# Patient Record
Sex: Male | Born: 1970
Health system: Southern US, Community
[De-identification: ages and names within clinical notes are randomized; demographics above are authoritative.]

## PROBLEM LIST (undated history)

## (undated) DIAGNOSIS — I10 Essential (primary) hypertension: Secondary | ICD-10-CM

## (undated) HISTORY — DX: Essential (primary) hypertension: I10

## (undated) HISTORY — PX: NO PAST SURGERIES: SHX2092

## (undated) HISTORY — DX: Morbid (severe) obesity due to excess calories: E66.01

---

## 1998-08-26 ENCOUNTER — Emergency Department (HOSPITAL_COMMUNITY): Admission: EM | Admit: 1998-08-26 | Discharge: 1998-08-26 | Payer: Self-pay | Admitting: Emergency Medicine

## 1998-11-01 ENCOUNTER — Encounter: Admission: RE | Admit: 1998-11-01 | Discharge: 1998-11-01 | Payer: Self-pay | Admitting: Hematology and Oncology

## 1999-12-13 ENCOUNTER — Emergency Department (HOSPITAL_COMMUNITY): Admission: EM | Admit: 1999-12-13 | Discharge: 1999-12-14 | Payer: Self-pay | Admitting: Emergency Medicine

## 2004-06-01 ENCOUNTER — Emergency Department (HOSPITAL_COMMUNITY): Admission: EM | Admit: 2004-06-01 | Discharge: 2004-06-01 | Payer: Self-pay | Admitting: Emergency Medicine

## 2004-06-24 ENCOUNTER — Ambulatory Visit: Payer: Self-pay | Admitting: Internal Medicine

## 2004-07-16 ENCOUNTER — Encounter: Admission: RE | Admit: 2004-07-16 | Discharge: 2004-07-16 | Payer: Self-pay | Admitting: Internal Medicine

## 2004-07-16 ENCOUNTER — Ambulatory Visit: Payer: Self-pay | Admitting: Internal Medicine

## 2004-07-30 ENCOUNTER — Emergency Department (HOSPITAL_COMMUNITY): Admission: EM | Admit: 2004-07-30 | Discharge: 2004-07-30 | Payer: Self-pay | Admitting: Emergency Medicine

## 2004-09-30 ENCOUNTER — Ambulatory Visit: Payer: Self-pay | Admitting: Internal Medicine

## 2004-11-06 ENCOUNTER — Ambulatory Visit: Payer: Self-pay | Admitting: Internal Medicine

## 2004-11-22 ENCOUNTER — Ambulatory Visit: Payer: Self-pay | Admitting: Internal Medicine

## 2005-04-07 ENCOUNTER — Ambulatory Visit: Payer: Self-pay | Admitting: Internal Medicine

## 2005-09-22 ENCOUNTER — Ambulatory Visit: Payer: Self-pay | Admitting: Internal Medicine

## 2005-10-20 ENCOUNTER — Encounter: Admission: RE | Admit: 2005-10-20 | Discharge: 2006-01-18 | Payer: Self-pay | Admitting: Internal Medicine

## 2006-01-19 ENCOUNTER — Ambulatory Visit: Payer: Self-pay | Admitting: Internal Medicine

## 2006-01-20 ENCOUNTER — Ambulatory Visit: Payer: Self-pay | Admitting: Internal Medicine

## 2006-02-09 ENCOUNTER — Ambulatory Visit: Payer: Self-pay | Admitting: Internal Medicine

## 2006-05-12 ENCOUNTER — Ambulatory Visit: Payer: Self-pay | Admitting: Internal Medicine

## 2006-06-02 ENCOUNTER — Ambulatory Visit: Payer: Self-pay | Admitting: Internal Medicine

## 2006-06-30 ENCOUNTER — Ambulatory Visit: Payer: Self-pay | Admitting: Internal Medicine

## 2006-07-01 ENCOUNTER — Ambulatory Visit: Payer: Self-pay | Admitting: Internal Medicine

## 2006-07-06 ENCOUNTER — Ambulatory Visit: Payer: Self-pay | Admitting: Gastroenterology

## 2006-08-12 ENCOUNTER — Ambulatory Visit: Payer: Self-pay | Admitting: Internal Medicine

## 2006-09-28 ENCOUNTER — Ambulatory Visit: Payer: Self-pay | Admitting: Internal Medicine

## 2006-09-28 LAB — CONVERTED CEMR LAB
BUN: 7 mg/dL (ref 6–23)
CO2: 28 meq/L (ref 19–32)
Calcium: 8.9 mg/dL (ref 8.4–10.5)
Chloride: 98 meq/L (ref 96–112)
Creatinine, Ser: 1 mg/dL (ref 0.4–1.5)
GFR calc Af Amer: 111 mL/min
GFR calc non Af Amer: 91 mL/min
Glucose, Bld: 82 mg/dL (ref 70–99)
Potassium: 3.5 meq/L (ref 3.5–5.1)
Sodium: 134 meq/L — ABNORMAL LOW (ref 135–145)

## 2007-01-27 IMAGING — US US ABDOMEN COMPLETE
1 series · 13 of 25 positions shown · non-contrast
Comparison: none

[Series 1: us abdomen complete · 0.16mm/px · 13 of 55 slices shown]
[im 1/55]
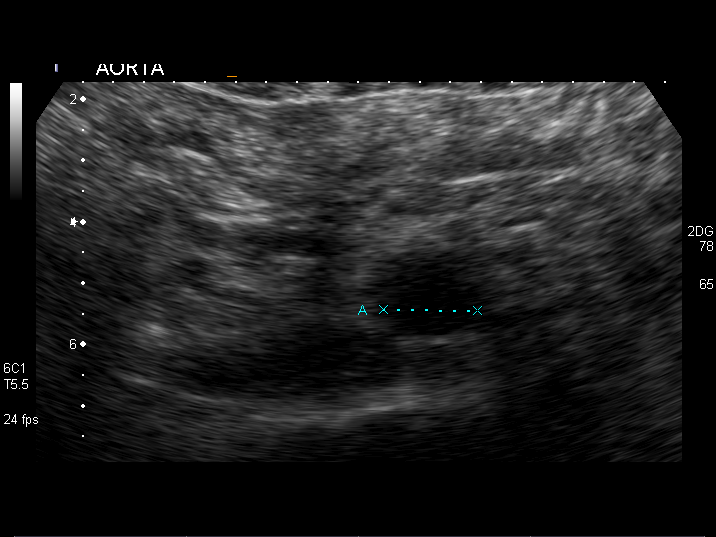
[im 5/55]
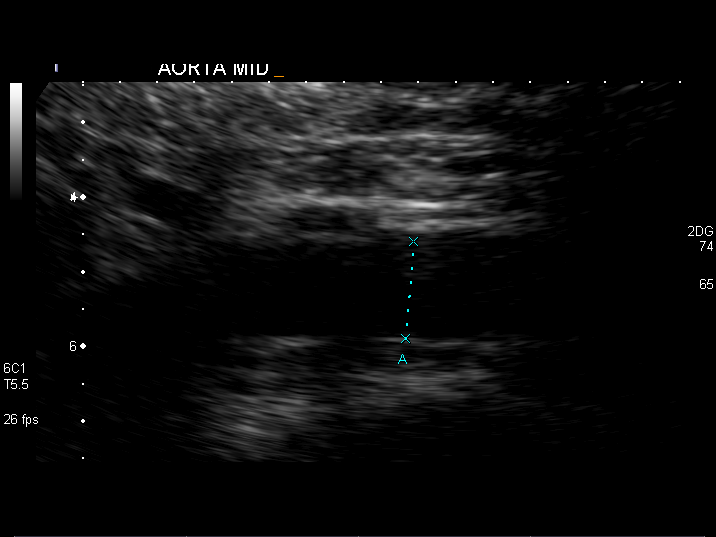
[im 10/55]
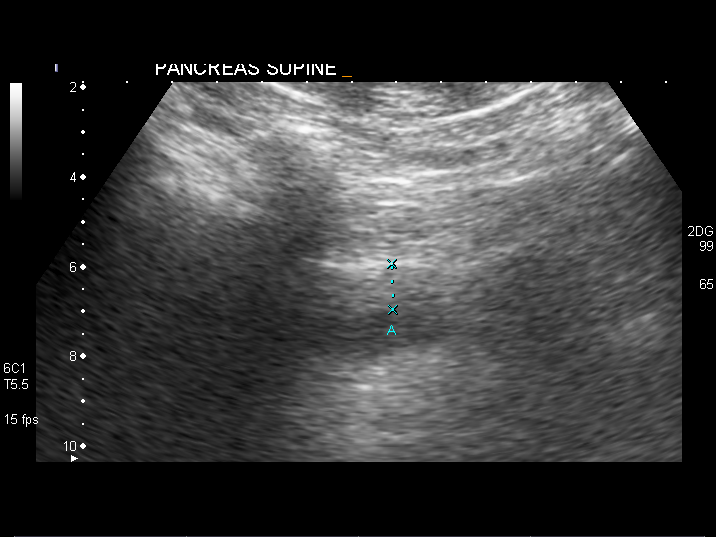
[im 14/55]
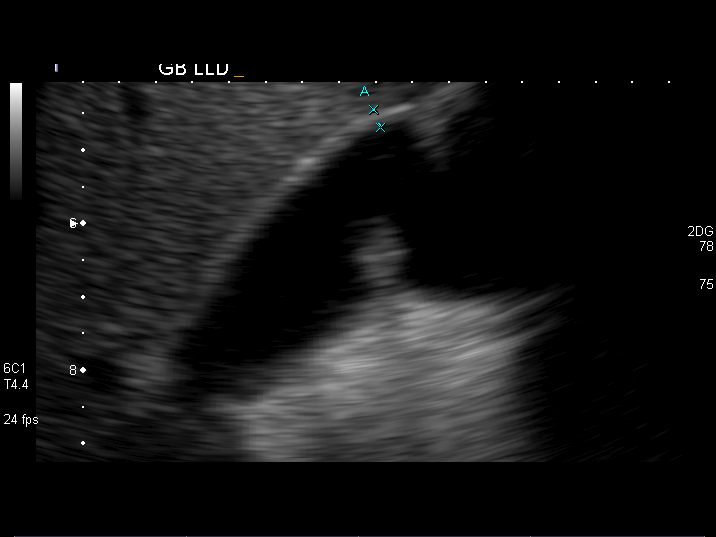
[im 19/55]
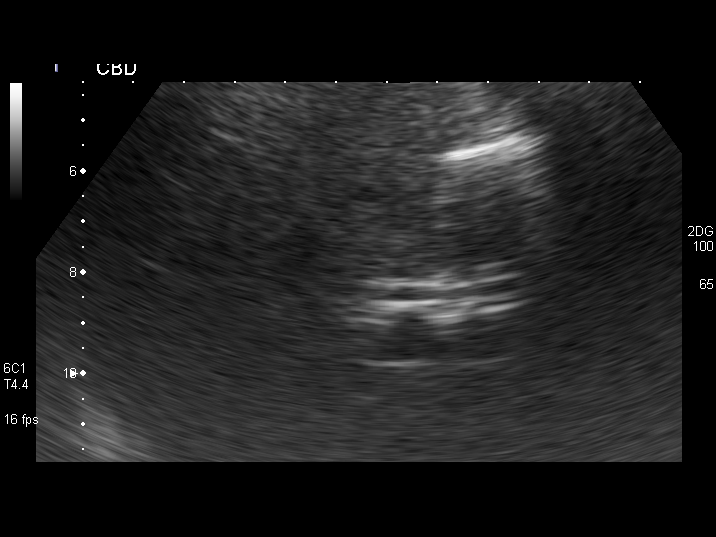
[im 23/55]
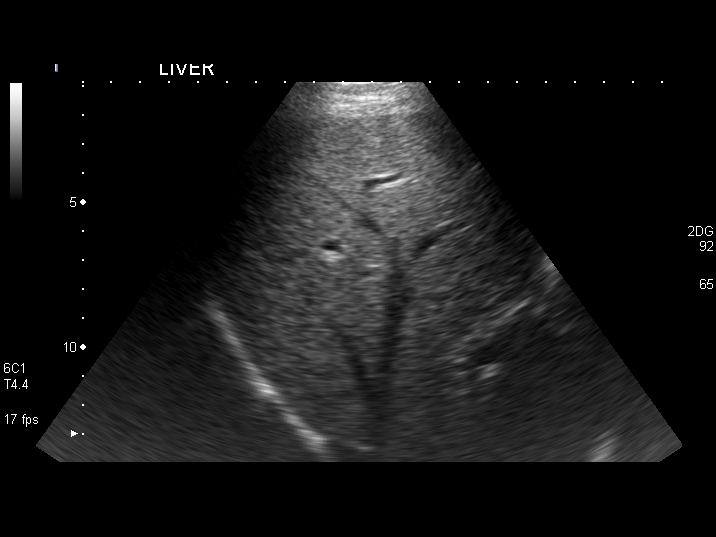
[im 28/55]
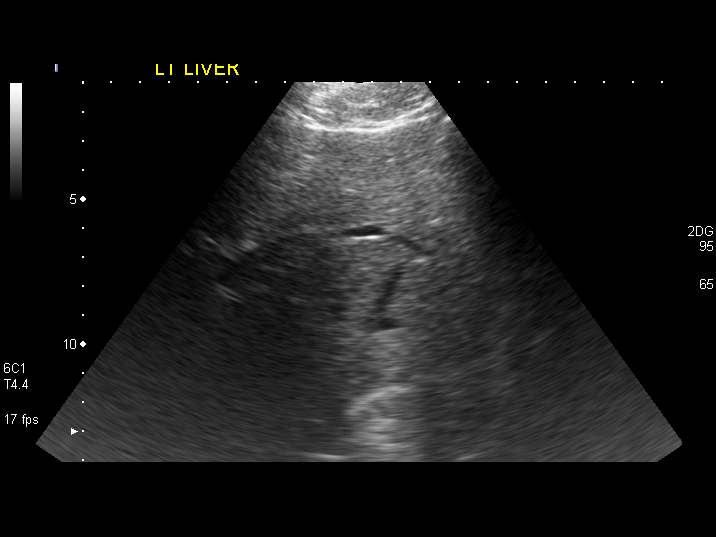
[im 32/55]
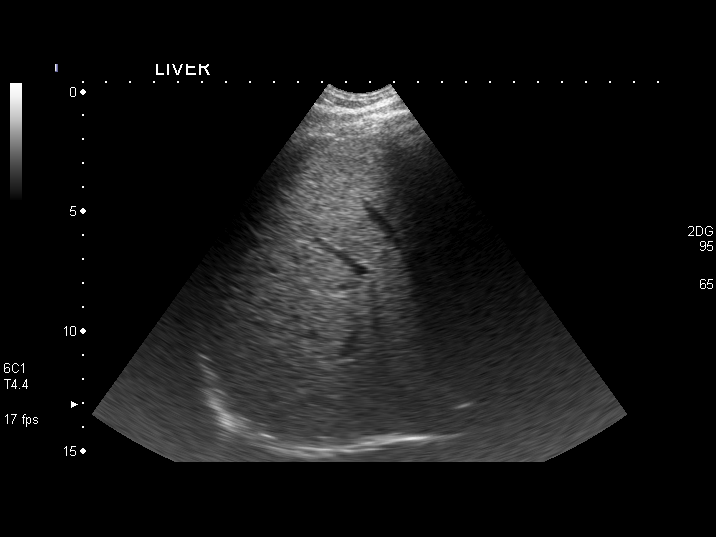
[im 37/55]
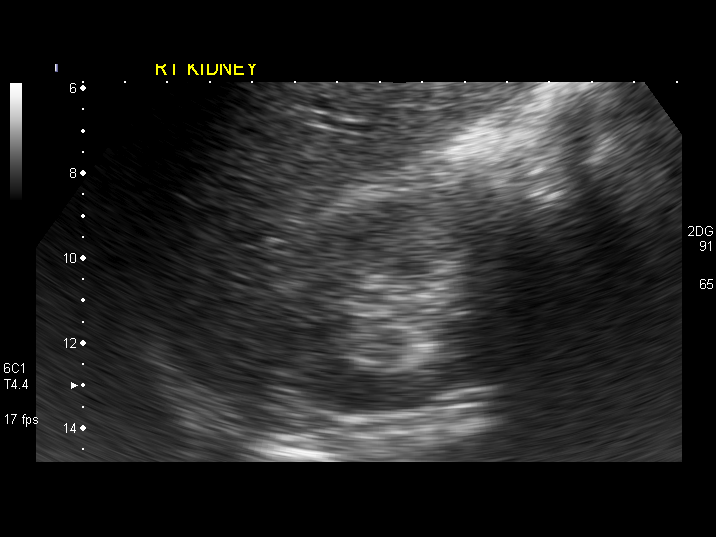
[im 41/55]
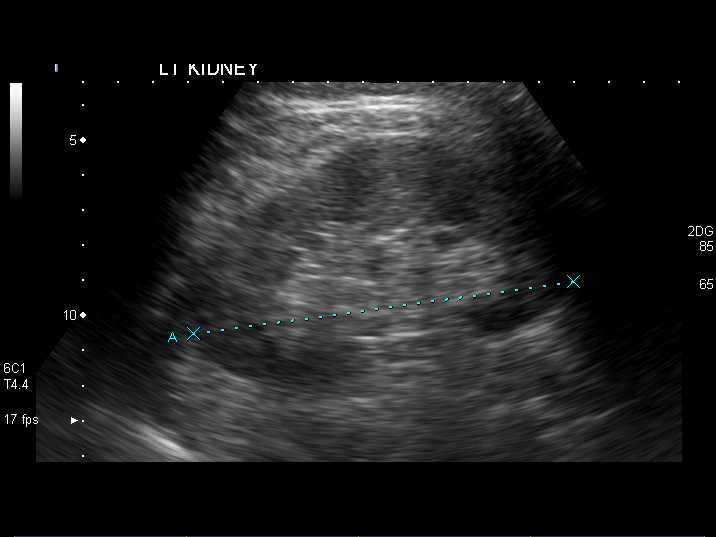
[im 46/55]
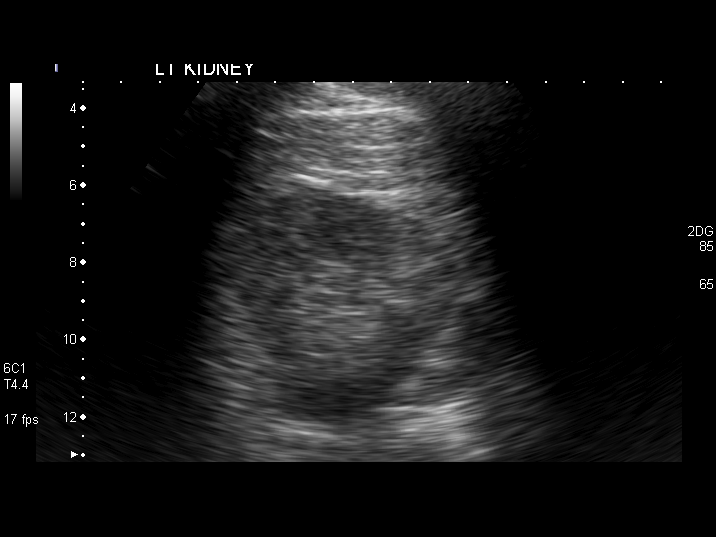
[im 50/55]
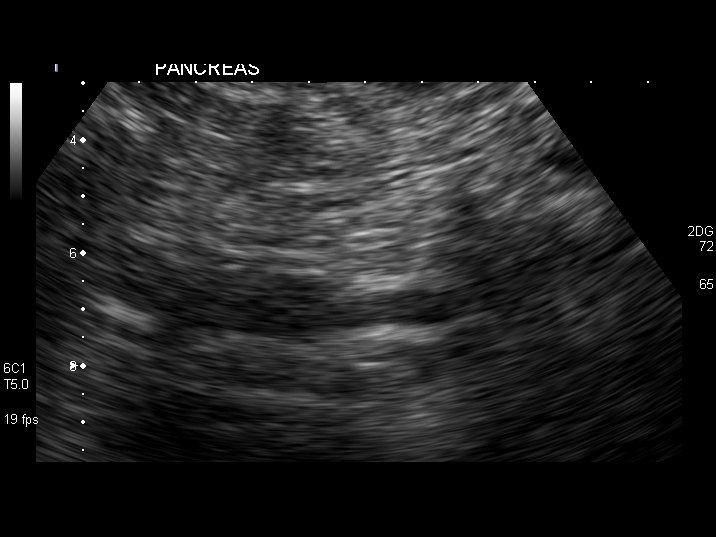
[im 55/55]
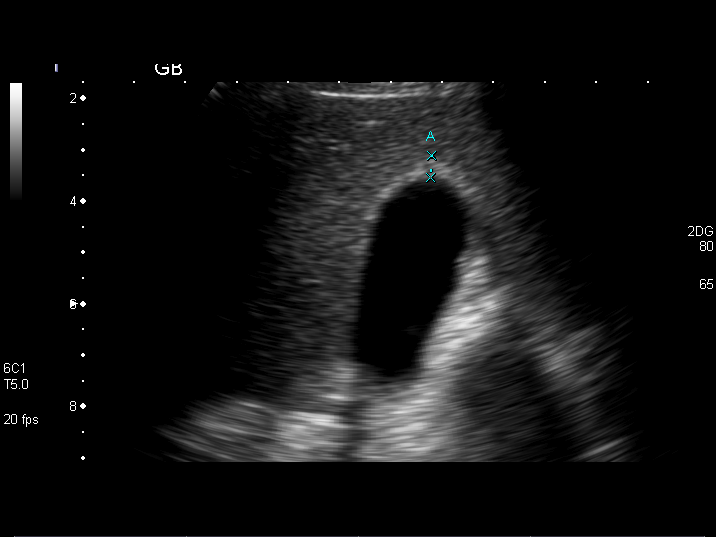

[13 of 25 positions shown; findings below may reference images not displayed]

ACCESSION NUMBER:  [id number].

ORDERING PHYSICIAN:  Dr. Ryuto Sawafuji. 

READING PHYSICIAN:  Askor, Arsu

PROCEDURE:  Multiplanar abdominal ultrasound imaging was performed in the upright, supine, right and left lateral decubitus positions.

RESULTS
Abdominal aorta measures 1.5 cm in maximal diameter.  The IVC is patent. 

The body of the  pancreas appears normal.  The head and tail of the pancreas are not imaged.  

In the gallbladder there is slight wall thickening with one area measuring 4.3 mm and another area 3.7 mm.  Other areas of the gallbladder wall appear normal The remainder of the gallbladder is exam is normal with no pericholecystic fluid or intraluminal echogenic foci to suggest gallstone disease.  

The common bile duct measures 4 mm in maximal diameter without evidence of intraluminal foci.

The liver appears normal without evidence of parenchymal lesion, ductal dilatation or vascular abnormality.

The right kidney measures 9.3 cm and left kidney 10.9 cm.  Both kidneys are normal in appearance.

Spleen is normal in size, measuring 9.3 cm without parenchymal lesion.

IMPRESSION 
1.  Mildly thickened gallbladder walls.
2.  Limited pancreatic imaging.

## 2007-04-20 ENCOUNTER — Encounter (INDEPENDENT_AMBULATORY_CARE_PROVIDER_SITE_OTHER): Payer: Self-pay | Admitting: *Deleted

## 2007-04-21 ENCOUNTER — Telehealth (INDEPENDENT_AMBULATORY_CARE_PROVIDER_SITE_OTHER): Payer: Self-pay | Admitting: *Deleted

## 2007-05-12 ENCOUNTER — Ambulatory Visit: Payer: Self-pay | Admitting: Internal Medicine

## 2007-05-12 ENCOUNTER — Encounter (INDEPENDENT_AMBULATORY_CARE_PROVIDER_SITE_OTHER): Payer: Self-pay | Admitting: Internal Medicine

## 2007-05-12 DIAGNOSIS — K589 Irritable bowel syndrome without diarrhea: Secondary | ICD-10-CM

## 2007-05-12 DIAGNOSIS — I10 Essential (primary) hypertension: Secondary | ICD-10-CM | POA: Insufficient documentation

## 2007-05-14 LAB — CONVERTED CEMR LAB
ALT: 26 units/L (ref 0–53)
AST: 28 units/L (ref 0–37)
BUN: 10 mg/dL (ref 6–23)
Basophils Absolute: 0 10*3/uL (ref 0.0–0.1)
Basophils Relative: 0.2 % (ref 0.0–1.0)
CO2: 31 meq/L (ref 19–32)
Calcium: 9.5 mg/dL (ref 8.4–10.5)
Chloride: 105 meq/L (ref 96–112)
Cholesterol: 185 mg/dL (ref 0–200)
Creatinine, Ser: 1.1 mg/dL (ref 0.4–1.5)
Eosinophils Absolute: 0 10*3/uL (ref 0.0–0.6)
Eosinophils Relative: 0.8 % (ref 0.0–5.0)
GFR calc Af Amer: 99 mL/min
GFR calc non Af Amer: 81 mL/min
Glucose, Bld: 92 mg/dL (ref 70–99)
HCT: 43.7 % (ref 39.0–52.0)
HDL: 44.8 mg/dL (ref >39.0)
Hemoglobin: 14.8 g/dL (ref 13.0–17.0)
LDL Cholesterol: 129 mg/dL — ABNORMAL HIGH (ref 0–99)
Lymphocytes Relative: 34.5 % (ref 12.0–46.0)
MCHC: 33.9 g/dL (ref 30.0–36.0)
MCV: 87.8 fL (ref 78.0–100.0)
Monocytes Absolute: 0.4 10*3/uL (ref 0.2–0.7)
Monocytes Relative: 8.1 % (ref 3.0–11.0)
Neutro Abs: 2.7 10*3/uL (ref 1.4–7.7)
Neutrophils Relative %: 56.4 % (ref 43.0–77.0)
Platelets: 337 10*3/uL (ref 150–400)
Potassium: 4.7 meq/L (ref 3.5–5.1)
RBC: 4.98 M/uL (ref 4.22–5.81)
RDW: 12.8 % (ref 11.5–14.6)
Sodium: 141 meq/L (ref 135–145)
TSH: 0.47 microintl units/mL (ref 0.35–5.50)
Total CHOL/HDL Ratio: 4.1
Triglycerides: 56 mg/dL (ref 0–149)
VLDL: 11 mg/dL (ref 0–40)
WBC: 4.7 10*3/uL (ref 4.5–10.5)

## 2007-05-19 ENCOUNTER — Telehealth (INDEPENDENT_AMBULATORY_CARE_PROVIDER_SITE_OTHER): Payer: Self-pay | Admitting: *Deleted

## 2007-05-24 ENCOUNTER — Encounter: Payer: Self-pay | Admitting: Internal Medicine

## 2007-09-30 ENCOUNTER — Telehealth (INDEPENDENT_AMBULATORY_CARE_PROVIDER_SITE_OTHER): Payer: Self-pay | Admitting: *Deleted

## 2008-08-14 ENCOUNTER — Ambulatory Visit: Payer: Self-pay | Admitting: Internal Medicine

## 2008-08-21 LAB — CONVERTED CEMR LAB
ALT: 21 units/L (ref 0–53)
AST: 23 units/L (ref 0–37)
BUN: 11 mg/dL (ref 6–23)
Basophils Absolute: 0 10*3/uL (ref 0.0–0.1)
Basophils Relative: 0.3 % (ref 0.0–3.0)
CO2: 31 meq/L (ref 19–32)
Calcium: 9.5 mg/dL (ref 8.4–10.5)
Chloride: 105 meq/L (ref 96–112)
Cholesterol: 205 mg/dL (ref 0–200)
Creatinine, Ser: 0.8 mg/dL (ref 0.4–1.5)
Direct LDL: 125.6 mg/dL
Eosinophils Absolute: 0 10*3/uL (ref 0.0–0.7)
Eosinophils Relative: 0.6 % (ref 0.0–5.0)
GFR calc Af Amer: 141 mL/min
GFR calc non Af Amer: 117 mL/min
Glucose, Bld: 87 mg/dL (ref 70–99)
HCT: 44.8 % (ref 39.0–52.0)
HDL: 67.1 mg/dL (ref >39.0)
Hemoglobin: 15.4 g/dL (ref 13.0–17.0)
Lymphocytes Relative: 44.9 % (ref 12.0–46.0)
MCHC: 34.4 g/dL (ref 30.0–36.0)
MCV: 88.8 fL (ref 78.0–100.0)
Monocytes Absolute: 0.3 10*3/uL (ref 0.1–1.0)
Monocytes Relative: 7.6 % (ref 3.0–12.0)
Neutro Abs: 1.8 10*3/uL (ref 1.4–7.7)
Neutrophils Relative %: 46.6 % (ref 43.0–77.0)
Platelets: 279 10*3/uL (ref 150–400)
Potassium: 4.1 meq/L (ref 3.5–5.1)
RBC: 5.04 M/uL (ref 4.22–5.81)
RDW: 12.6 % (ref 11.5–14.6)
Sodium: 142 meq/L (ref 135–145)
TSH: 0.82 microintl units/mL (ref 0.35–5.50)
Total CHOL/HDL Ratio: 3.1
Triglycerides: 41 mg/dL (ref 0–149)
VLDL: 8 mg/dL (ref 0–40)
WBC: 3.9 10*3/uL — ABNORMAL LOW (ref 4.5–10.5)

## 2009-10-17 ENCOUNTER — Telehealth (INDEPENDENT_AMBULATORY_CARE_PROVIDER_SITE_OTHER): Payer: Self-pay | Admitting: *Deleted

## 2009-10-17 ENCOUNTER — Encounter (INDEPENDENT_AMBULATORY_CARE_PROVIDER_SITE_OTHER): Payer: Self-pay | Admitting: *Deleted

## 2009-11-21 ENCOUNTER — Ambulatory Visit: Payer: Self-pay | Admitting: Internal Medicine

## 2009-11-23 LAB — CONVERTED CEMR LAB
ALT: 17 units/L (ref 0–53)
AST: 21 units/L (ref 0–37)
BUN: 15 mg/dL (ref 6–23)
Basophils Absolute: 0 10*3/uL (ref 0.0–0.1)
Basophils Relative: 0.6 % (ref 0.0–3.0)
CO2: 32 meq/L (ref 19–32)
Calcium: 9.3 mg/dL (ref 8.4–10.5)
Chloride: 104 meq/L (ref 96–112)
Cholesterol: 185 mg/dL (ref 0–200)
Creatinine, Ser: 1.1 mg/dL (ref 0.4–1.5)
Eosinophils Absolute: 0 10*3/uL (ref 0.0–0.7)
Eosinophils Relative: 0.2 % (ref 0.0–5.0)
GFR calc non Af Amer: 97.01 mL/min (ref >60)
Glucose, Bld: 82 mg/dL (ref 70–99)
HCT: 45.1 % (ref 39.0–52.0)
HDL: 65.3 mg/dL (ref >39.00)
Hemoglobin: 15.4 g/dL (ref 13.0–17.0)
LDL Cholesterol: 109 mg/dL — ABNORMAL HIGH (ref 0–99)
Lymphocytes Relative: 41.8 % (ref 12.0–46.0)
Lymphs Abs: 1.3 10*3/uL (ref 0.7–4.0)
MCHC: 34.1 g/dL (ref 30.0–36.0)
MCV: 89.2 fL (ref 78.0–100.0)
Monocytes Absolute: 0.4 10*3/uL (ref 0.1–1.0)
Monocytes Relative: 10.8 % (ref 3.0–12.0)
Neutro Abs: 1.5 10*3/uL (ref 1.4–7.7)
Neutrophils Relative %: 46.6 % (ref 43.0–77.0)
Platelets: 311 10*3/uL (ref 150.0–400.0)
Potassium: 4.5 meq/L (ref 3.5–5.1)
RBC: 5.05 M/uL (ref 4.22–5.81)
RDW: 13.8 % (ref 11.5–14.6)
Sodium: 142 meq/L (ref 135–145)
TSH: 0.66 microintl units/mL (ref 0.35–5.50)
Total CHOL/HDL Ratio: 3
Triglycerides: 54 mg/dL (ref 0.0–149.0)
VLDL: 10.8 mg/dL (ref 0.0–40.0)
WBC: 3.2 10*3/uL — ABNORMAL LOW (ref 4.5–10.5)

## 2010-07-22 ENCOUNTER — Telehealth (INDEPENDENT_AMBULATORY_CARE_PROVIDER_SITE_OTHER): Payer: Self-pay | Admitting: *Deleted

## 2010-08-26 ENCOUNTER — Telehealth (INDEPENDENT_AMBULATORY_CARE_PROVIDER_SITE_OTHER): Payer: Self-pay | Admitting: *Deleted

## 2010-09-17 NOTE — Letter (Signed)
Summary: Primary Care Appointment Letter  [facility]  [address] [place], [place] [address]   Phone: [phone]  Fax: [phone]    10/17/2009 MRN: [id number]  [name] [name] [address] [place], [place]  [address]  Dear [name],   Your Primary Care Physician Nolon Rod. Paz MD has indicated that:    ___x____it is time to schedule an appointment. Schedule a physical.    _______you missed your appointment on______ and need to call and          reschedule.    _______you need to have lab work done.    _______you need to schedule an appointment discuss lab or test results.    _______you need to call to reschedule your appointment that is                       scheduled on _________.     Please call our office as soon as possible. Our phone number is [phone]_______. Please press option 1. Our office is open 8a-12noon and 1p-5p, Monday through Friday.     Thank you,    [facility]

## 2010-09-17 NOTE — Assessment & Plan Note (Signed)
Summary: cpx//pt will be fasting//lch   Vital Signs:  Patient profile:   40 year old male Height:      65.75 inches Weight:      176 pounds BMI:     28.73 Pulse rate:   72 / minute BP sitting:   120 / 80  Vitals Entered By: Shary Decamp (November 21, 2009 9:38 AM) CC: cpx - fasting Is Patient Diabetic? No   History of Present Illness: CPX feels very well   Preventive Screening-Counseling & Management  Caffeine-Diet-Exercise     Does Patient Exercise: yes     Times/week: 6  Current Medications (verified): 1)  Lotrel 10-20 Mg  Caps (Amlodipine Besy-Benazepril Hcl) .Marland Kitchen.. 1 By Mouth Once Daily  Brand Name Medicaly Necessary -  Allergies (verified): No Known Drug Allergies  Past History:  Past Medical History: Reviewed history from 05/12/2007 and no changes required. Hypertension  Past Surgical History: Reviewed history from 08/14/2008 and no changes required. no  Family History: Reviewed history from 08/14/2008 and no changes required. 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 CV:  Denies chest pain or discomfort and palpitations. Resp:  Denies cough and shortness of breath. GI:  Denies bloody stools, diarrhea, and nausea. GU:  Denies dysuria and hematuria.  Physical Exam  General:  alert, well-developed, and well-nourished.   Neck:  no thyromegaly.   Lungs:  normal respiratory effort, no intercostal retractions, no accessory muscle use, and normal breath sounds.   Heart:  normal rate, regular rhythm, and no murmur.   Abdomen:  soft, non-tender, no distention, no masses, and no guarding.   Extremities:  no pretibial edema bilaterally  Psych:  Cognition and judgment appear intact. Alert and cooperative with normal attention span and  concentration.     Impression & Recommendations:  Problem # 1:  HEALTH SCREENING (ICD-V70.0) Td 2008 great life style! (see above) labs   Orders: Venipuncture ([address]) TLB-BMP (Basic Metabolic Panel-BMET) (80048-METABOL) TLB-CBC Platelet - w/Differential (85025-CBCD) TLB-Lipid Panel (80061-LIPID) TLB-ALT (SGPT) (84460-ALT) TLB-AST (SGOT) (84450-SGOT) TLB-TSH (Thyroid Stimulating Hormone) (84443-TSH)  Problem # 2:  HYPERTENSION (ICD-401.9) well controlled , to call if BP gets < 110 His updated medication list for this problem includes:    Lotrel 10-20 Mg Caps (Amlodipine besy-benazepril hcl) .Marland Kitchen... 1 by mouth once daily  brand name medicaly necessary -  BP today: 120/80 Prior BP: 122/80 (08/14/2008)  Labs Reviewed: K+: 4.1 (08/14/2008) Creat: : 0.8 (08/14/2008)   Chol: 205 (08/14/2008)   HDL: 67.1 (08/14/2008)   LDL: DEL (08/14/2008)   TG: 41 (08/14/2008)  Complete Medication List: 1)  Lotrel 10-20 Mg Caps (Amlodipine besy-benazepril hcl) .Marland Kitchen.. 1 by mouth once daily  brand name medicaly necessary -  Patient Instructions: 1)  Please schedule a follow-up appointment in 1 year.  Prescriptions: LOTREL 10-20 MG  CAPS (AMLODIPINE BESY-BENAZEPRIL HCL) 1 by mouth once daily  brand name medicaly necessary -  #90 x 4   Entered and Authorized by:   Nolon Rod. Wren Gallaga MD   Signed by:   Nolon Rod. Orianna Biskup MD on 11/21/2009   Method used:   [facility]        [facility] [address] [phone]       [address]  [place], [place]  [address]       Ph: [phone] or [phone]       Fax: [phone]   RxID:   [id number]    Risk Factors:  Exercise:  yes    Times per week:  6    Preventive Care Screening  Prior Values:    Last Tetanus Booster:  Tdap (05/12/2007)

## 2010-09-17 NOTE — Progress Notes (Signed)
Summary: due cpx  Phone Note Outgoing Call Call back at [facility] Phone [phone] Call back at Work Phone [phone]   Summary of Call: I refilled his BP med x 1 month only.  Last ov here was 12/09.  No additional refills without ov.  Please schedule him for a cpx Shary Decamp  October 17, 2009 1:28 PM     Additional Follow-up for Phone Call Additional follow up Details #2::    mailed a letter.Marland KitchenMarland KitchenBarb Merino  October 17, 2009 2:38 PM  Follow-up by: Barb Merino,  October 17, 2009 2:38 PM

## 2010-09-17 NOTE — Progress Notes (Signed)
Summary: REFILL  Phone Note Refill Request Message from:  Fax from Pharmacy on July 22, 2010 4:51 PM  Refills Requested: Medication #1:  LOTREL 10-20 MG  CAPS 1 by mouth once daily  brand name medicaly necessary - [BMN]. [facility] - Valinda Hoar [phone]  Initial call taken by: Okey Regal [place],  July 22, 2010 4:51 PM    Prescriptions: LOTREL 10-20 MG  CAPS (AMLODIPINE BESY-BENAZEPRIL HCL) 1 by mouth once daily  brand name medicaly necessary - Brand medically necessary #90 x 1   Entered by:   Army Fossa CMA   Authorized by:   Nolon Rod. Paz MD   Signed by:   Army Fossa CMA on 07/22/2010   Method used:   Faxed to ...       [facility] (mail-order)             , [place]         Ph: [phone]       Fax: [phone]   RxID:   [id number]

## 2010-09-19 NOTE — Progress Notes (Signed)
Summary: Refill Request  Phone Note Refill Request Call back at [phone] Message from:  Pharmacy on August 26, 2010 8:43 AM  Refills Requested: Medication #1:  LOTREL 10-20 MG  CAPS 1 by mouth once daily  brand name medicaly necessary - [BMN].   Dosage confirmed as above?Dosage Confirmed   Brand Name Necessary? No   Supply Requested: 1 month   Last Refilled: 07/22/2010 [facility]  Next Appointment Scheduled: none Initial call taken by: Harold Barban,  August 26, 2010 8:44 AM    Prescriptions: LOTREL 10-20 MG  CAPS (AMLODIPINE BESY-BENAZEPRIL HCL) 1 by mouth once daily  brand name medicaly necessary - Brand medically necessary #90 x 0   Entered by:   Army Fossa CMA   Authorized by:   Nolon Rod. Paz MD   Signed by:   Army Fossa CMA on 08/26/2010   Method used:   Faxed to ...       [facility] (mail-order)             , [place]         Ph: [phone]       Fax: [phone]   RxID:   [id number]

## 2010-09-23 ENCOUNTER — Telehealth: Payer: Self-pay | Admitting: Internal Medicine

## 2010-10-03 NOTE — Progress Notes (Signed)
Summary: Refill Request  Phone Note Refill Request Call back at [phone] Message from:  Pharmacy on September 23, 2010 9:14 AM  Refills Requested: Medication #1:  LOTREL 10-20 MG  CAPS 1 by mouth once daily  brand name medicaly necessary - [BMN].   Dosage confirmed as above?Dosage Confirmed   Brand Name Necessary? No   Supply Requested: 3 months   Last Refilled: 08/26/2010   Notes: would like generic of amlldipine/benazepril 10/20mg  [facility]  Next Appointment Scheduled: none Initial call taken by: Harold Barban,  September 23, 2010 9:15 AM  Follow-up for Phone Call        Okay to give Generic?  Follow-up by: Army Fossa CMA,  September 23, 2010 10:13 AM  Additional Follow-up for Phone Call Additional follow up Details #1::        I think he prefers branded medication. OK generics only if okay with the patient. Ask him call  #90, 1 RF next OV 11-2010 Additional Follow-up by: [facility] E. Paz MD,  September 23, 2010 5:20 PM    Additional Follow-up for Phone Call Additional follow up Details #2::    Called phone # on file- number temporaily out of service. suggestions? Army Fossa CMA  September 24, 2010 11:16 AM stay on branded, he can talk w/ his [address] E. Paz MD  September 24, 2010 1:06 PM   Prescriptions: LOTREL 10-20 MG  CAPS (AMLODIPINE BESY-BENAZEPRIL HCL) 1 by mouth once daily  brand name medicaly necessary - Brand medically necessary #90 x 0   Entered by:   Army Fossa CMA   Authorized by:   Nolon Rod. Paz MD   Signed by:   Army Fossa CMA on 09/24/2010   Method used:   Faxed to ...       [facility] (mail-order)             , [place]         Ph: [phone]       Fax: [phone]   RxID:   [id number]

## 2010-12-18 ENCOUNTER — Other Ambulatory Visit: Payer: Self-pay | Admitting: Internal Medicine

## 2010-12-26 ENCOUNTER — Telehealth: Payer: Self-pay | Admitting: *Deleted

## 2010-12-26 NOTE — Telephone Encounter (Signed)
Received a fax from [facility] asking to switch to generic Lotrel. Per Dr.Paz okay with him as long as the pt is okay with it. I tried to call pt on home number, non working number. 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

## 2010-12-27 ENCOUNTER — Encounter: Payer: Self-pay | Admitting: *Deleted

## 2010-12-27 NOTE — Telephone Encounter (Signed)
Unable to reach pt on numbers provided. Will mail pt a letter asking him to call the office.

## 2011-01-03 NOTE — Assessment & Plan Note (Signed)
[facility]                         GASTROENTEROLOGY OFFICE NOTE   NAME:[name] [name], [name] [name]                             MRN:          [id number]  DATE:08/12/2006                            DOB:          1970/12/15    [name] [name] presents today for followup.  He was initially evaluated June 02, 2006, for epigastric pain.  See that dictation for details.  Helicobacter pylori antibody testing was negative.  CBC was normal.  As  he was feeling better, he was followed expectantly.  However, he  contacted the office complaining of recurrent symptoms.  Thus, an upper  endoscopy was performed July 01, 2006.  This was essentially normal  except for an incidental pancreatic rest.  He was felt possibly to have  functional bowel disease.  He was given Levsin sublingual and an  abdominal ultrasound was scheduled.  The ultrasound was unremarkable.  He presents now for followup as requested.  Since his endoscopy, he has  been doing well.  He does report alternating bowel habits between  constipation and diarrhea.  Some bloating and cramping.  This has  improved with daily Fibercon.  Also, sublingual Levsin helps discomfort.  No new problems such as bleeding.  His appetite is good, and he has  actually gained weight.  He does inquire about the nonprescription  herbal remedy for irritable bowel syndrome.   ALLERGIES:  HE HAS NO KNOWN DRUG ALLERGIES.   MEDICATIONS:  1. Lotrel 10/20 mg daily.  2. Fibercon daily.  3. Levsin sublingual p.r.n.   PHYSICAL EXAMINATION:  Well-appearing male, in no acute distress.  Blood pressure 124/90.  Heart rate 72 and regular.  Weight is 212 pounds  (increased 14 pounds).  HEENT:  Sclerae anicteric.  ABDOMEN:  Soft without tenderness, mass or hernia.  Good bowel sounds  heard.   IMPRESSION:  Problems with alternating bowel habits and abdominal  discomfort most consistent with irritable bowel.  Negative workup as  described.   RECOMMENDATIONS:  1. Continue daily fiber supplementation.  2. Continue Levsin sublingual p.r.n.  3. I informed the patient that I was unable to advise him regarding      the use of a      nonprescription herbal remedy.  4. Resume general medical care with Dr. Drue Novel.     Wilhemina Bonito. Marina Goodell, MD  Electronically Signed    JNP/MedQ  DD: 08/12/2006  DT: 08/12/2006  Job #: [id number]   cc:   Willow Ora, MD

## 2011-01-03 NOTE — Assessment & Plan Note (Signed)
[facility]                           GASTROENTEROLOGY OFFICE NOTE   NAME:[name], [name]                             MRN:          [id number]  DATE:06/02/2006                            DOB:          [birth year]    REFERRING PHYSICIAN:  Willow Ora, MD   REASON FOR CONSULTATION:  Epigastric pain.   HISTORY:  This is a 40 year old African-American male with a history of  hypertension who is referred through the courtesy of Dr. Drue [name] regarding  epigastric burning. The patient reports being in his usual state of health  until about 3-4 weeks ago when he developed focal epigastric burning  discomfort. The discomfort occurred throughout most portions of the day.  There was no associated nausea, vomiting, heartburn, change in bowel habits,  melena, or fevers. The pain did not radiate. No change in urine color. The  discomfort was improved after meals. He was seen by Dr. Drue [name] who prescribed  Prilosec OTC for 14 days. After about 1 week, the patient's abdominal  discomfort resolved. He attributes this to an exercise regimen that was  started about the same time. He denies aspirin and nonsteroidal  antiinflammatory drug use. He has been completely asymptomatic for the past  2 weeks.   PAST MEDICAL HISTORY:  Hypertension.   PAST SURGICAL HISTORY:  None.   CURRENT MEDICATIONS:  Lotrel 10/20 mg daily.   ALLERGIES:  No known drug allergies.   FAMILY HISTORY:  Negative for gastrointestinal disorders.   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   REVIEW OF SYSTEMS:  Per diagnostic evaluation form.   PHYSICAL EXAMINATION:  GENERAL:  Well-appearing male in no acute distress.  VITAL SIGNS:  Blood pressure is 142/80, heart rate is 72 and regular, weight  is 198 pounds. He is 5 feet 6 inches in height.  HEENT:  Sclera anicteric. Conjunctiva pink. Oral mucosa intact.  LUNGS:  Clear.  HEART:  Regular.  ABDOMEN:  Soft without tenderness, mass or hernia. Good bowel sounds heard.   IMPRESSION:  Recent problems with focal epigastric pain, improved with  meals. Possible duodenal ulcer. The symptoms have resolved after a 2 week  course of Prilosec.   RECOMMENDATIONS:  1. Obtain a Helicobacter pylori antibody and treat if positive.  2. Obtain CBC to exclude anemia. If anemia present then consider      endoscopy.  3. Followup expectantly as the patient is improved. If symptoms recur, he      may need diagnostic upper endoscopy. The patient understands this plan      and agrees to proceed accordingly.            ______________________________  Wilhemina Bonito. Eda Keys., MD      JNP/MedQ  DD:  06/02/2006  DT:  06/03/2006  Job #:  [id number]   cc:   Willow Ora, MD

## 2011-03-24 ENCOUNTER — Telehealth: Payer: Self-pay | Admitting: *Deleted

## 2011-03-24 MED ORDER — AMLODIPINE BESY-BENAZEPRIL HCL 10-20 MG PO CAPS: 1.0000 | ORAL_CAPSULE | Freq: Every day | ORAL | Status: DC

## 2011-03-24 NOTE — Telephone Encounter (Signed)
rx sent to pharmacy

## 2011-04-10 ENCOUNTER — Encounter: Payer: Self-pay | Admitting: Internal Medicine

## 2011-04-11 ENCOUNTER — Encounter: Payer: Self-pay | Admitting: Internal Medicine

## 2011-05-02 ENCOUNTER — Encounter: Payer: Self-pay | Admitting: Internal Medicine

## 2011-05-23 ENCOUNTER — Encounter: Payer: Self-pay | Admitting: Internal Medicine

## 2011-05-30 ENCOUNTER — Encounter: Payer: Self-pay | Admitting: Internal Medicine

## 2011-07-04 ENCOUNTER — Encounter: Payer: Self-pay | Admitting: Internal Medicine

## 2011-08-04 ENCOUNTER — Encounter: Payer: Self-pay | Admitting: Internal Medicine

## 2011-08-15 ENCOUNTER — Encounter: Payer: Self-pay | Admitting: Internal Medicine

## 2011-08-15 ENCOUNTER — Ambulatory Visit (INDEPENDENT_AMBULATORY_CARE_PROVIDER_SITE_OTHER): Payer: BC Managed Care – PPO | Admitting: Internal Medicine

## 2011-08-15 DIAGNOSIS — Z0000 Encounter for general adult medical examination without abnormal findings: Secondary | ICD-10-CM | POA: Insufficient documentation

## 2011-08-15 DIAGNOSIS — I10 Essential (primary) hypertension: Secondary | ICD-10-CM

## 2011-08-15 LAB — COMPREHENSIVE METABOLIC PANEL
AST: 25 U/L (ref 0–37)
Albumin: 4.6 g/dL (ref 3.5–5.2)
Alkaline Phosphatase: 68 U/L (ref 39–117)
BUN: 14 mg/dL (ref 6–23)
CO2: 29 mEq/L (ref 19–32)
Calcium: 9.4 mg/dL (ref 8.4–10.5)
Chloride: 104 mEq/L (ref 96–112)
GFR: 91.3 mL/min (ref >60.00)
Glucose, Bld: 82 mg/dL (ref 70–99)
Potassium: 4.8 mEq/L (ref 3.5–5.1)
Sodium: 143 mEq/L (ref 135–145)
Total Bilirubin: 0.3 mg/dL (ref 0.3–1.2)
Total Protein: 8.5 g/dL — ABNORMAL HIGH (ref 6.0–8.3)

## 2011-08-15 LAB — LIPID PANEL
Cholesterol: 191 mg/dL (ref 0–200)
HDL: 47.4 mg/dL (ref >39.00)
LDL Cholesterol: 136 mg/dL — ABNORMAL HIGH (ref 0–99)
Total CHOL/HDL Ratio: 4
Triglycerides: 39 mg/dL (ref 0.0–149.0)
VLDL: 7.8 mg/dL (ref 0.0–40.0)

## 2011-08-15 LAB — CBC WITH DIFFERENTIAL/PLATELET
Basophils Absolute: 0 10*3/uL (ref 0.0–0.1)
Basophils Relative: 0.3 % (ref 0.0–3.0)
Eosinophils Relative: 0.2 % (ref 0.0–5.0)
HCT: 44.9 % (ref 39.0–52.0)
Hemoglobin: 15.1 g/dL (ref 13.0–17.0)
Lymphocytes Relative: 31.9 % (ref 12.0–46.0)
MCHC: 33.7 g/dL (ref 30.0–36.0)
MCV: 88.4 fl (ref 78.0–100.0)
Monocytes Absolute: 0.3 10*3/uL (ref 0.1–1.0)
Monocytes Relative: 7 % (ref 3.0–12.0)
Neutrophils Relative %: 60.6 % (ref 43.0–77.0)
Platelets: 333 10*3/uL (ref 150.0–400.0)
RDW: 13.8 % (ref 11.5–14.6)
WBC: 4.9 10*3/uL (ref 4.5–10.5)

## 2011-08-15 MED ORDER — AMLODIPINE BESY-BENAZEPRIL HCL 10-20 MG PO CAPS: 1.0000 | ORAL_CAPSULE | Freq: Every day | ORAL | Status: DC

## 2011-08-15 NOTE — Patient Instructions (Signed)
Check the  blood pressure 2 or 3 times a week, be sure it is less than 130/85. If it is consistently higher, let me know. Diet! Go back to exercise! Came back in 1 year as long as your BP is ok

## 2011-08-15 NOTE — Assessment & Plan Note (Addendum)
Td 2008 Flu shot benefits discussed, will call if interested Diet-exercise used to be better, currently  dating a lady and not exercising as much: recommend to go back to his healthier lifestyle! Labs  STE discussed

## 2011-08-15 NOTE — Progress Notes (Signed)
  Subjective:    Patient ID: [name] [name], male    DOB: [birth year], 40 y.o.   MRN: [id number]  HPI CPX Feeling well except for the fact that he has not been eating healthy or exercise as much as before.  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  Respiratory: Negative for cough and shortness of breath.   Cardiovascular: Negative for chest pain and leg swelling.  Gastrointestinal: Negative for nausea, vomiting, abdominal pain and blood in stool.  Genitourinary: Negative for dysuria, hematuria and difficulty urinating.  Psychiatric/Behavioral:       No depression or anxiety        Objective:   Physical Exam  Constitutional: He is oriented to person, place, and time. He appears well-developed. No distress.       Overweight appearing  Neck: No thyromegaly present.  Cardiovascular: Normal rate, regular rhythm and normal heart sounds.   No murmur heard. Pulmonary/Chest: Effort normal and breath sounds normal. No respiratory distress. He has no wheezes. He has no rales.  Abdominal: Soft. Bowel sounds are normal. He exhibits no distension. There is no tenderness. There is no rebound and no guarding.  Musculoskeletal: Edema: trace symmetric pretibial edema.  Neurological: He is alert and oriented to person, place, and time.  Skin: Skin is warm and dry. He is not diaphoretic.  Psychiatric: He has a normal mood and affect. His behavior is normal. Judgment and thought content normal.      Assessment & Plan:

## 2011-08-15 NOTE — Assessment & Plan Note (Signed)
BP slt elevated today but reports amb BPs checked x 2/week and readings are normal: 120/80. Plan: no change, see instructions

## 2011-08-17 ENCOUNTER — Encounter: Payer: Self-pay | Admitting: Internal Medicine

## 2011-08-20 ENCOUNTER — Encounter: Payer: Self-pay | Admitting: Internal Medicine

## 2011-09-05 ENCOUNTER — Telehealth: Payer: Self-pay | Admitting: Internal Medicine

## 2011-09-05 NOTE — Telephone Encounter (Signed)
Patient is requesting samples of Lotrel to last him until he receives refill from [facility].

## 2011-09-05 NOTE — Telephone Encounter (Signed)
Left a message for pt to return call.  Called to make pt aware our office does not have any Lotrel samples.

## 2012-03-18 ENCOUNTER — Telehealth: Payer: Self-pay | Admitting: *Deleted

## 2012-03-18 NOTE — Telephone Encounter (Signed)
Pt called triage line to advise that he has recently submitted a request for his GENERIC lotrel to be sent to his mail order company however pt notes that he will run out before the mail order can arrive, pt requesting a 30 day supply to be sent to [facility] local [facility] [address]

## 2012-03-19 MED ORDER — AMLODIPINE BESY-BENAZEPRIL HCL 10-20 MG PO CAPS: 1.0000 | ORAL_CAPSULE | Freq: Every day | ORAL | Status: DC

## 2012-03-19 NOTE — Telephone Encounter (Signed)
Refill done.  

## 2012-04-20 ENCOUNTER — Telehealth: Payer: Self-pay | Admitting: *Deleted

## 2012-04-20 NOTE — Telephone Encounter (Signed)
Returned pt call, per noted left vm on triage line concerning his lotrel, called pt at home number and he advised that he is not in the position to discuss concern at this time and will call our office back as soon as he can

## 2012-04-20 NOTE — Telephone Encounter (Signed)
Caller: [name]/Patient; Patient Name: [name] [name]; PCP: Willow Ora; Best Callback Phone Number: [phone]; Has been on Lotrel since 2005. States is having problems with erection even in the morning.  When asked onset patient stated honestly since 2005 but has not started dating someone wh0 he wanted to be intimate until now. Was wondering if anything could be done.  Patient denied prior to triaged that he had no other problems.    OFFICE:  PLEASE FOLLOW UP WITH PATIENT REGARD CONCERNS OF LOTREL SEXUAL SIDE EFFECTS.  THANKS

## 2012-04-20 NOTE — Telephone Encounter (Signed)
Called pt and set up apt for 04-26-12 at 3:30pm to discuss concern with MD Drue Novel, pt accepted apt, noted scheduled in chart

## 2012-04-22 ENCOUNTER — Other Ambulatory Visit: Payer: Self-pay | Admitting: Internal Medicine

## 2012-04-22 NOTE — Telephone Encounter (Signed)
Refill done.  

## 2012-04-26 ENCOUNTER — Encounter: Payer: Self-pay | Admitting: Internal Medicine

## 2012-04-26 ENCOUNTER — Ambulatory Visit (INDEPENDENT_AMBULATORY_CARE_PROVIDER_SITE_OTHER): Payer: BC Managed Care – PPO | Admitting: Internal Medicine

## 2012-04-26 VITALS — BP 122/80 | HR 90 | Temp 98.3°F | Wt 236.0 lb

## 2012-04-26 DIAGNOSIS — N529 Male erectile dysfunction, unspecified: Secondary | ICD-10-CM | POA: Insufficient documentation

## 2012-04-26 DIAGNOSIS — I10 Essential (primary) hypertension: Secondary | ICD-10-CM

## 2012-04-26 MED ORDER — LOSARTAN POTASSIUM 100 MG PO TABS: 100.0000 mg | ORAL_TABLET | Freq: Every day | ORAL | Status: DC

## 2012-04-26 NOTE — Assessment & Plan Note (Addendum)
Complaining of ED for the last 2 years since he started Lotrel, we'll change Lotrel to losartan Will try to avoid diuretics, will consider add carvedilol if needed.  I stressed the need of a healthier diet and exercise, has gained 50 pounds in 2 years. See instructions

## 2012-04-26 NOTE — Assessment & Plan Note (Addendum)
Reports difficulty with erections since around 2005 when was started in Lotrel, has not mentioned this problem before but would like to do something about it. Literature reviewed, amlodipine and ACE inhibitors and not particularly linked to ED, diuretics and sympathetic blockers are. Given the time relationship between Lotrel and ED, will switch BP meds See Hypertension. Reassess on return to the office. Once we know his BP is okay, will prescribe Viagra or similar medication if needed

## 2012-04-26 NOTE — Patient Instructions (Signed)
Take medicines as prescribed Check the  blood pressure 2 or 3 times a week, be sure it is between 110/60 and 140/85. If it is consistently higher or lower, let me know Come back for followup in 3 weeks Work on a healthier  diet and exercise

## 2012-04-26 NOTE — Progress Notes (Signed)
  Subjective:    Patient ID: [name] [name], male    DOB: 05-04-1971, 41 y.o.   MRN: [id number]  HPI Acute visit Complaining of decreased quality and duration of erections for many years, symptoms started shortly after he started Lotrel for hypertension. In the last few years, that has not been a major issue but now he is on a stable relationship and would like some help. Denies chest pain, shortness of breath No lower extremity edema Ambulatory BPs 120/80 when checked. 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 LOSARTAN POTASSIUM 100 MG PO TABS Oral Take 1 tablet (100 mg total) by mouth daily. 30 tablet 1       Objective:   Physical Exam General -- alert, well-developed, quite overweight appearing.   Neck --no thyromegaly , normal carotid pulse Lungs -- normal respiratory effort, no intercostal retractions, no accessory muscle use, and normal breath sounds.   Heart-- normal rate, regular rhythm, no murmur, and no gallop.   Abdomen--no bruit.   Extremities-- no pretibial edema bilaterally, normal femoral pulses bilaterally Neurologic-- alert & oriented X3 and strength normal in all extremities. Psych-- Cognition and judgment appear intact. Alert and cooperative with normal attention span and concentration.  not anxious appearing and not depressed appearing.      Assessment & Plan:

## 2012-08-14 ENCOUNTER — Other Ambulatory Visit: Payer: Self-pay | Admitting: Internal Medicine

## 2012-08-16 NOTE — Telephone Encounter (Signed)
Refill done.  

## 2012-08-17 ENCOUNTER — Other Ambulatory Visit: Payer: Self-pay | Admitting: Internal Medicine

## 2012-08-17 NOTE — Telephone Encounter (Signed)
lmovm for pt to return call.  

## 2012-08-17 NOTE — Telephone Encounter (Signed)
Is the pt taking amlodipine or was it discontinued? Please advise.

## 2012-08-17 NOTE — Telephone Encounter (Signed)
Advise patient, he is supposed to be on losartan 100 mg one tablet daily, not on Lotrel. Also, he is due for a checkup. Please arrange

## 2013-03-23 ENCOUNTER — Encounter: Payer: BC Managed Care – PPO | Admitting: Internal Medicine

## 2013-05-17 ENCOUNTER — Telehealth: Payer: Self-pay

## 2013-05-17 NOTE — Telephone Encounter (Signed)
Unable to reach pre visit. Needs updated contact information.   Patient has seen [facility] for possible testosterone deficiency and Family Hx of prostate cancer

## 2013-05-18 ENCOUNTER — Encounter: Payer: BC Managed Care – PPO | Admitting: Internal Medicine

## 2013-06-14 ENCOUNTER — Telehealth: Payer: Self-pay

## 2013-06-14 NOTE — Telephone Encounter (Signed)
Left message for call back Non identifiable  Tdap--2008  Patient has seen [facility] for possible testosterone deficiency and Family Hx of prostate cancer  

## 2013-06-16 ENCOUNTER — Encounter: Payer: BC Managed Care – PPO | Admitting: Internal Medicine

## 2013-06-16 NOTE — Telephone Encounter (Signed)
No return No show

## 2013-08-03 ENCOUNTER — Telehealth: Payer: Self-pay

## 2013-08-03 NOTE — Telephone Encounter (Signed)
Left message for call back Non identifiable  Tdap--2008  Patient has seen [facility] for possible testosterone deficiency and Family Hx of prostate cancer  

## 2013-08-03 NOTE — Telephone Encounter (Signed)
Patient had to reschedule due to OOT family emergency

## 2013-08-04 ENCOUNTER — Encounter: Payer: BC Managed Care – PPO | Admitting: Internal Medicine

## 2013-08-04 DIAGNOSIS — Z0289 Encounter for other administrative examinations: Secondary | ICD-10-CM

## 2013-09-05 ENCOUNTER — Telehealth: Payer: Self-pay

## 2013-09-05 NOTE — Telephone Encounter (Signed)
Left message for call back Non identifiable  Tdap--2008  Patient has seen [facility] for possible testosterone deficiency and Family Hx of prostate cancer

## 2013-09-06 NOTE — Telephone Encounter (Signed)
Patient rescheduled

## 2013-09-07 ENCOUNTER — Encounter: Payer: BC Managed Care – PPO | Admitting: Internal Medicine

## 2013-09-19 ENCOUNTER — Telehealth: Payer: Self-pay

## 2013-09-19 NOTE — Telephone Encounter (Signed)
Medication List and allergies:  Reviewed and updated  90 day supply/mail order: na [facility] [address] [address]  Immunizations due: declines flu vaccine  A/P:   No changes to FH, PSH or Personal Hx Flu vaccine--declines Tdap--2008  PSA--Patient has seen [facility] for possible testosterone deficiency and Family Hx of prostate cancer    To Discuss with Provider: Patient had lost his job and has not been taking his BP meds due to finances. States that he checked it last week and it was 140/89

## 2013-09-21 ENCOUNTER — Encounter: Payer: Self-pay | Admitting: Internal Medicine

## 2013-09-21 ENCOUNTER — Ambulatory Visit (INDEPENDENT_AMBULATORY_CARE_PROVIDER_SITE_OTHER): Payer: BC Managed Care – PPO | Admitting: Internal Medicine

## 2013-09-21 VITALS — BP 126/84 | HR 88 | Temp 97.8°F | Ht 65.5 in | Wt 236.0 lb

## 2013-09-21 DIAGNOSIS — Z0000 Encounter for general adult medical examination without abnormal findings: Secondary | ICD-10-CM

## 2013-09-21 DIAGNOSIS — I10 Essential (primary) hypertension: Secondary | ICD-10-CM

## 2013-09-21 NOTE — Progress Notes (Signed)
   Subjective:    Patient ID: [name] [name], male    DOB: [birth year], 43 y.o.   MRN: [id number]  HPI CPX See HTN  Past Medical History  Diagnosis Date  . Hypertension    Past Surgical History  Procedure Laterality Date  . No past surgeries     History   Social History  . Marital Status: Single    Spouse Name: N/A    Number of Children: 0  . Years of Education: N/A   Occupational History  . [employer]     Social History Main Topics  . Smoking status: Never Smoker   . Smokeless tobacco: Never Used  . Alcohol Use: No  . Drug Use: No  . Sexual Activity: Not on file   Other Topics Concern  . Not on file   Social History Narrative   Lives by himself   Family History  Problem Relation Age of Onset  . Hypertension Mother   . Prostate cancer Other     dx in his 1770s  . Diabetes Other   . Colon cancer Neg Hx   . CAD Neg Hx     Review of Systems Diet-- healthier lately  Exercise-- back on the gym x 1 month No  CP, SOB, lower extremity edema Denies  nausea, vomiting diarrhea Denies  blood in the stools (-) cough, sputum production, (-) wheezing, chest congestion No dysuria, gross hematuria, difficulty urinating   No anxiety, depression        Objective:   Physical Exam BP 126/84  Pulse 88  Temp(Src) 97.8 F (36.6 C)  Ht 5' 5.5" (1.664 m)  Wt 236 lb (107.049 kg)  BMI 38.66 kg/m2  SpO2 98% General -- alert, well-developed, NAD.  Neck --no thyromegaly , normal carotid pulse Lungs -- normal respiratory effort, no intercostal retractions, no accessory muscle use, and normal breath sounds.  Heart-- normal rate, regular rhythm, no murmur.  Abdomen-- Not distended, good bowel sounds,soft, non-tender. Extremities-- no pretibial edema bilaterally  Neurologic--  alert & oriented X3. Speech normal, gait normal, strength normal in all extremities.  Psych-- Cognition and judgment appear intact. Cooperative with normal attention span and concentration. No anxious or depressed  appearing.     Assessment & Plan:

## 2013-09-21 NOTE — Assessment & Plan Note (Signed)
At some point was taking losartan but he ran out a while back. BP today is very good, BP elsewhere few days ago  138/89. Plan: Monitor BP, will restart medication if needed, see instructions.

## 2013-09-21 NOTE — Assessment & Plan Note (Addendum)
Td 2008 Flu shot declined  Saw [facility] for possible testosterone deficiency it was wnl per pt Labs Here lately is doing much better with diet and exercise, praised!  STE

## 2013-09-21 NOTE — Progress Notes (Signed)
Pre visit review using our clinic review tool, if applicable. No additional management support is needed unless otherwise documented below in the visit note. 

## 2013-09-21 NOTE — Patient Instructions (Signed)
Get your blood work before you leave   Next visit is for a physical exam in 1 year, fasting Please make an appointment     Check the  blood pressure   monthly be sure it is between 110/60 and 140/85. Ideal blood pressure is 120/80. If it is consistently higher or lower, let me know   Testicular Self-Exam A self-examination of your testicles involves looking at and feeling your testicles for abnormal lumps or swelling. Several things can cause swelling, lumps, or pain in your testicles. Some of these causes are:  Injuries.  Inflammation.  Infection.  Accumulation of fluids around your testicle (hydrocele).  Twisted testicles (testicular torsion).  Testicular cancer. Self-examination of the testicles and groin areas may be advised if you are at risk for testicular cancer. Risks for testicular cancer include:  An undescended testicle (cryptorchidism).  A history of previous testicular cancer.  A family history of testicular cancer. The testicles are easiest to examine after warm baths or showers and are more difficult to examine when you are cold. This is because the muscles attached to the testicles retract and pull them up higher or into the abdomen. Follow these steps while you are standing:  Hold your penis away from your body.  Roll one testicle between your thumb and forefinger, feeling the entire testicle.  Roll the other testicle between your thumb and forefinger, feeling the entire testicle. Feel for lumps, swelling, or discomfort. A normal testicle is egg shaped and feels firm. It is smooth and not tender. The spermatic cord can be felt as a firm spaghetti-like cord at the back of your testicle. It is also important to examine the crease between the front of your leg and your abdomen. Feel for any bumps that are tender. These could be enlarged lymph nodes.  Document Released: 11/10/2000 Document Revised: 04/06/2013 Document Reviewed: 01/24/2013 [facility] Patient  Information 2014 [place], [place].

## 2013-09-22 LAB — CBC WITH DIFFERENTIAL/PLATELET
Basophils Absolute: 0 10*3/uL (ref 0.0–0.1)
Basophils Relative: 0.5 % (ref 0.0–3.0)
Eosinophils Absolute: 0.1 10*3/uL (ref 0.0–0.7)
Eosinophils Relative: 1.2 % (ref 0.0–5.0)
HCT: 46 % (ref 39.0–52.0)
Hemoglobin: 15.1 g/dL (ref 13.0–17.0)
LYMPHS ABS: 2.2 10*3/uL (ref 0.7–4.0)
Lymphocytes Relative: 41.1 % (ref 12.0–46.0)
MCHC: 33 g/dL (ref 30.0–36.0)
MCV: 88.8 fl (ref 78.0–100.0)
Monocytes Absolute: 0.3 10*3/uL (ref 0.1–1.0)
Monocytes Relative: 6.2 % (ref 3.0–12.0)
NEUTROS ABS: 2.7 10*3/uL (ref 1.4–7.7)
Neutrophils Relative %: 51 % (ref 43.0–77.0)
Platelets: 342 10*3/uL (ref 150.0–400.0)
RBC: 5.18 Mil/uL (ref 4.22–5.81)
RDW: 14.4 % (ref 11.5–14.6)
WBC: 5.4 10*3/uL (ref 4.5–10.5)

## 2013-09-22 LAB — TSH: TSH: 0.45 u[IU]/mL (ref 0.35–5.50)

## 2013-09-22 LAB — LIPID PANEL
Cholesterol: 197 mg/dL (ref 0–200)
HDL: 44.2 mg/dL (ref >39.00)
LDL Cholesterol: 140 mg/dL — ABNORMAL HIGH (ref 0–99)
Total CHOL/HDL Ratio: 4
Triglycerides: 66 mg/dL (ref 0.0–149.0)
VLDL: 13.2 mg/dL (ref 0.0–40.0)

## 2013-09-22 LAB — COMPREHENSIVE METABOLIC PANEL
ALT: 32 U/L (ref 0–53)
AST: 27 U/L (ref 0–37)
Albumin: 4.2 g/dL (ref 3.5–5.2)
Alkaline Phosphatase: 67 U/L (ref 39–117)
BUN: 15 mg/dL (ref 6–23)
CALCIUM: 9.4 mg/dL (ref 8.4–10.5)
CO2: 28 mEq/L (ref 19–32)
CREATININE: 1.1 mg/dL (ref 0.4–1.5)
Chloride: 105 mEq/L (ref 96–112)
GFR: 95.07 mL/min (ref >60.00)
Glucose, Bld: 79 mg/dL (ref 70–99)
POTASSIUM: 4.1 meq/L (ref 3.5–5.1)
Sodium: 140 mEq/L (ref 135–145)
Total Bilirubin: 0.8 mg/dL (ref 0.3–1.2)
Total Protein: 8.3 g/dL (ref 6.0–8.3)

## 2013-09-23 ENCOUNTER — Encounter: Payer: Self-pay | Admitting: *Deleted

## 2013-09-23 ENCOUNTER — Telehealth: Payer: Self-pay | Admitting: Internal Medicine

## 2013-09-23 NOTE — Telephone Encounter (Signed)
Relevant patient education mailed to patient.  

## 2018-02-04 ENCOUNTER — Ambulatory Visit: Payer: Self-pay | Admitting: Medical

## 2018-04-08 ENCOUNTER — Telehealth: Payer: Self-pay

## 2018-04-08 NOTE — Telephone Encounter (Signed)
Please schedule a new pt appointment at his convenience

## 2018-04-08 NOTE — Telephone Encounter (Signed)
Copied from CRM [id number]. Topic: Appointment Scheduling - Scheduling Inquiry for Clinic >> Apr 08, 2018 10:29 AM Crist InfanteHarrald, Kathy J wrote: Reason for CRM: pt would like to re establish with Dr Drue NovelPaz.  Not seen since 2015.

## 2018-04-08 NOTE — Telephone Encounter (Signed)
Please advise if ok to scheduled re-establish new patient. Has not been seen in 4 years.

## 2018-04-13 NOTE — Telephone Encounter (Signed)
Spoke with pt and informed pt the below, pt stated will call to schedule his appt, pt needed to verify his work schedule.

## 2018-07-20 ENCOUNTER — Telehealth: Payer: Self-pay

## 2018-07-20 NOTE — Telephone Encounter (Signed)
Please schedule at his convenience.

## 2018-07-20 NOTE — Telephone Encounter (Signed)
Last OV 2015- please advise.

## 2018-07-20 NOTE — Telephone Encounter (Signed)
Copied from CRM [id number]. Topic: Appointment Scheduling - New Patient >> Jul 20, 2018  2:37 PM Fanny Bienlderton, Jessica L wrote: Pt called and stated that he would like to re establish care with Dr Drue NovelPaz. Please advise

## 2018-07-21 NOTE — Telephone Encounter (Signed)
Appt scheduled 07/23/2018.  

## 2018-07-21 NOTE — Telephone Encounter (Signed)
Okay to schedule appt at his convenience.

## 2018-07-21 NOTE — Telephone Encounter (Signed)
Called pt and LVM informing pt that Dr Drue NovelPaz agreed to him re-establishing care. Advised pt to call back and schedule at his convenience.

## 2018-07-23 ENCOUNTER — Ambulatory Visit: Payer: 59 | Admitting: Internal Medicine

## 2018-07-23 ENCOUNTER — Encounter: Payer: Self-pay | Admitting: Internal Medicine

## 2018-07-23 VITALS — BP 158/88 | HR 98 | Temp 98.0°F | Resp 16 | Ht 66.0 in | Wt 277.2 lb

## 2018-07-23 DIAGNOSIS — Z23 Encounter for immunization: Secondary | ICD-10-CM | POA: Diagnosis not present

## 2018-07-23 DIAGNOSIS — Z0000 Encounter for general adult medical examination without abnormal findings: Secondary | ICD-10-CM | POA: Diagnosis not present

## 2018-07-23 LAB — CBC WITH DIFFERENTIAL/PLATELET
Basophils Absolute: 0 10*3/uL (ref 0.0–0.1)
Basophils Relative: 0.7 % (ref 0.0–3.0)
Eosinophils Absolute: 0.1 10*3/uL (ref 0.0–0.7)
Eosinophils Relative: 1 % (ref 0.0–5.0)
HCT: 45 % (ref 39.0–52.0)
Hemoglobin: 14.9 g/dL (ref 13.0–17.0)
Lymphocytes Relative: 31.9 % (ref 12.0–46.0)
Lymphs Abs: 1.9 10*3/uL (ref 0.7–4.0)
MCHC: 33.1 g/dL (ref 30.0–36.0)
MCV: 85.8 fl (ref 78.0–100.0)
Monocytes Absolute: 0.3 10*3/uL (ref 0.1–1.0)
Monocytes Relative: 5.8 % (ref 3.0–12.0)
Neutro Abs: 3.5 10*3/uL (ref 1.4–7.7)
Neutrophils Relative %: 60.6 % (ref 43.0–77.0)
Platelets: 338 10*3/uL (ref 150.0–400.0)
RBC: 5.25 Mil/uL (ref 4.22–5.81)
RDW: 14.8 % (ref 11.5–15.5)
WBC: 5.8 10*3/uL (ref 4.0–10.5)

## 2018-07-23 LAB — COMPREHENSIVE METABOLIC PANEL
ALT: 21 U/L (ref 0–53)
AST: 18 U/L (ref 0–37)
Albumin: 4.3 g/dL (ref 3.5–5.2)
Alkaline Phosphatase: 71 U/L (ref 39–117)
BUN: 13 mg/dL (ref 6–23)
CO2: 29 mEq/L (ref 19–32)
Calcium: 9.5 mg/dL (ref 8.4–10.5)
Chloride: 102 mEq/L (ref 96–112)
Creatinine, Ser: 1.12 mg/dL (ref 0.40–1.50)
GFR: 91.01 mL/min (ref >60.00)
Glucose, Bld: 85 mg/dL (ref 70–99)
Potassium: 4.4 mEq/L (ref 3.5–5.1)
Sodium: 140 mEq/L (ref 135–145)
Total Bilirubin: 0.4 mg/dL (ref 0.2–1.2)
Total Protein: 7.8 g/dL (ref 6.0–8.3)

## 2018-07-23 LAB — POC URINALSYSI DIPSTICK (AUTOMATED)
Bilirubin, UA: NEGATIVE
Blood, UA: NEGATIVE
Glucose, UA: NEGATIVE
Ketones, UA: NEGATIVE
Leukocytes, UA: NEGATIVE
Nitrite, UA: NEGATIVE
Protein, UA: NEGATIVE
Spec Grav, UA: >=1.03 — AB (ref 1.010–1.025)
Urobilinogen, UA: 0.2 E.U./dL
pH, UA: 6 (ref 5.0–8.0)

## 2018-07-23 LAB — LIPID PANEL
Cholesterol: 230 mg/dL — ABNORMAL HIGH (ref 0–200)
HDL: 45.6 mg/dL (ref >39.00)
LDL Cholesterol: 167 mg/dL — ABNORMAL HIGH (ref 0–99)
NonHDL: 184.2
Total CHOL/HDL Ratio: 5
Triglycerides: 85 mg/dL (ref 0.0–149.0)
VLDL: 17 mg/dL (ref 0.0–40.0)

## 2018-07-23 LAB — TSH: TSH: 0.72 u[IU]/mL (ref 0.35–4.50)

## 2018-07-23 LAB — HEMOGLOBIN A1C: Hgb A1c MFr Bld: 6.3 % (ref 4.6–6.5)

## 2018-07-23 MED ORDER — KETOCONAZOLE 2 % EX CREA: 1.0000 "application " | TOPICAL_CREAM | Freq: Every day | CUTANEOUS | 0 refills | Status: DC

## 2018-07-23 NOTE — Progress Notes (Signed)
Pre visit review using our clinic review tool, if applicable. No additional management support is needed unless otherwise documented below in the visit note. 

## 2018-07-23 NOTE — Assessment & Plan Note (Signed)
--  Td 2008;  Had a flu shot at work [employer]: never had a cscope Prostate ca screening: Will assess next year Labs: Udip normal. We will check CMP FLP CBC A1c TSH Lifestyle extensively discussed

## 2018-07-23 NOTE — Progress Notes (Signed)
Subjective:    Patient ID: [name] [name], male    DOB: [birth year], 47 y.o.   MRN: [id number]  DOS:  07/23/2018 Type of visit - description : CPX, here to reestablish.  Last visit 2015  His main concern is his weight, and also likes  to know if he has diabetes.  Review of Systems  He has 2 jobs, very busy, in general feeling well. Occasionally has urinary frequency, nocturia x1 but not every night. Admits to snoring, energy level is "okay".  Other than above, a 14 point review of systems is negative   Past Medical History:  Diagnosis Date  . Hypertension   . Morbid obesity (HCC)     Past Surgical History:  Procedure Laterality Date  . NO PAST SURGERIES      Family History  Problem Relation Age of Onset  . Hypertension Mother   . Prostate cancer Other        dx in his 20s  . Diabetes Other   . Colon cancer Neg Hx   . CAD Neg Hx      Social History   Socioeconomic History  . Marital status: Single    Spouse name: Not on file  . Number of children: 0  . Years of education: Not on file  . Highest education level: Not on file  Occupational History  . Occupation: [employer]   . Occupation: [employer]  Social Needs  . Financial resource strain: Not on file  . Food insecurity:    Worry: Not on file    Inability: Not on file  . Transportation needs:    Medical: Not on file    Non-medical: Not on file  Tobacco Use  . Smoking status: Never Smoker  . Smokeless tobacco: Never Used  Substance and Sexual Activity  . Alcohol use: No  . Drug use: No  . Sexual activity: Not on file  Lifestyle  . Physical activity:    Days per week: Not on file    Minutes per session: Not on file  . Stress: Not on file  Relationships  . Social connections:    Talks on phone: Not on file    Gets together: Not on file    Attends religious service: Not on file    Active member of club or organization: Not on file    Attends meetings of clubs or organizations: Not on file     Relationship status: Not on file  . Intimate partner violence:    Fear of current or ex partner: Not on file    Emotionally abused: Not on file    Physically abused: Not on file    Forced sexual activity: Not on file  Other Topics Concern  . Not on file  Social History Narrative   Lives by himself      Allergies as of 07/23/2018   No Known Allergies     Medication List        Accurate as of 07/23/18 11:59 PM. Always use your most recent med list.          ketoconazole 2 % cream Commonly known as:  NIZORAL Apply 1 application topically daily.           Objective:   Physical Exam BP (!) 158/88 (BP Location: Left Arm, Patient Position: Sitting, Cuff Size: Normal)   Pulse 98   Temp 98 F (36.7 C) (Oral)   Resp 16   Ht 5\' 6"  (1.676 m)   Wt  277 lb 4 oz (125.8 kg)   SpO2 98%   BMI 44.75 kg/m  General: Well developed, NAD, BMI noted Neck: No  thyromegaly  HEENT:  Normocephalic . Face symmetric, atraumatic Lungs:  CTA B Normal respiratory effort, no intercostal retractions, no accessory muscle use. Heart: RRR,  no murmur.  No pretibial edema bilaterally  Abdomen:  Not distended, soft, non-tender. No rebound or rigidity.   Skin: Exposed areas without rash. Not pale. Not jaundice Neurologic:  alert & oriented X3.  Speech normal, gait appropriate for age and unassisted Strength symmetric and appropriate for age.  Psych: Cognition and judgment appear intact.  Cooperative with normal attention span and concentration.  Behavior appropriate. No anxious or depressed appearing.     Assessment & Plan:   Assessment HTN Morbid obesity  Plan: HTN: On no medications.  BP today is 158/88, BPs at home in the 140s/80s. Patient is determined to work on his lifestyle, will recheck him in 3 months.  If not better will add a BP medication Morbid obesity.  Since the last time I saw him in 2015 he gained 40 pounds, most of the weight gain have been in the last 2 years.   We had extensive discussion about risk of morbid obesity.  Recommend to see the wellness clinic. Snoring: Epworth scale 8, average, reassess periodically Has a itchy rash x 2 weeks , R arm, round , ~ 1 cm in size, rx nizoral RTC 3 months

## 2018-07-23 NOTE — Patient Instructions (Signed)
GO TO THE LAB : Get the blood work     GO TO THE FRONT DESK Schedule your next appointment for a   Check up in 3 months    Check the  blood pressure  weekly  Be sure your blood pressure is between 110/65 and  135/85. If it is consistently higher or lower, let me know

## 2018-07-25 DIAGNOSIS — Z09 Encounter for follow-up examination after completed treatment for conditions other than malignant neoplasm: Secondary | ICD-10-CM | POA: Insufficient documentation

## 2018-07-25 NOTE — Assessment & Plan Note (Signed)
HTN: On no medications.  BP today is 158/88, BPs at home in the 140s/80s. Patient is determined to work on his lifestyle, will recheck him in 3 months.  If not better will add a BP medication Morbid obesity.  Since the last time I saw him in 2015 he gained 40 pounds, most of the weight gain have been in the last 2 years.  We had extensive discussion about risk of morbid obesity.  Recommend to see the wellness clinic. Snoring: Epworth scale 8, average, reassess periodically Has a itchy rash x 2 weeks , R arm, round , ~ 1 cm in size, rx nizoral RTC 3 months

## 2019-03-17 ENCOUNTER — Encounter: Payer: Self-pay | Admitting: Internal Medicine

## 2020-03-15 ENCOUNTER — Ambulatory Visit: Payer: 59 | Admitting: Internal Medicine

## 2020-03-15 ENCOUNTER — Encounter: Payer: Self-pay | Admitting: Internal Medicine

## 2020-03-15 ENCOUNTER — Other Ambulatory Visit: Payer: Self-pay

## 2020-03-15 VITALS — BP 132/88 | HR 112 | Temp 98.2°F | Resp 16 | Ht 66.0 in | Wt 237.4 lb

## 2020-03-15 DIAGNOSIS — R350 Frequency of micturition: Secondary | ICD-10-CM | POA: Diagnosis not present

## 2020-03-15 DIAGNOSIS — R399 Unspecified symptoms and signs involving the genitourinary system: Secondary | ICD-10-CM | POA: Diagnosis not present

## 2020-03-15 LAB — POC URINALSYSI DIPSTICK (AUTOMATED)
Bilirubin, UA: NEGATIVE
Glucose, UA: NEGATIVE
Leukocytes, UA: NEGATIVE
Nitrite, UA: NEGATIVE
Protein, UA: POSITIVE — AB
Spec Grav, UA: 1.02 (ref 1.010–1.025)
Urobilinogen, UA: 0.2 E.U./dL
pH, UA: 6 (ref 5.0–8.0)

## 2020-03-15 NOTE — Progress Notes (Signed)
Pre visit review using our clinic review tool, if applicable. No additional management support is needed unless otherwise documented below in the visit note. 

## 2020-03-15 NOTE — Patient Instructions (Signed)
    GO TO THE LAB : Get the blood work     GO TO THE FRONT DESK, PLEASE SCHEDULE YOUR APPOINTMENTS Come back for   a physical exam in 3 months 

## 2020-03-15 NOTE — Progress Notes (Signed)
   Subjective:    Patient ID: [name] [name], male    DOB: 1971/02/28, 49 y.o.   MRN: [id number]  DOS:  03/15/2020 Type of visit - description: Acute visit For few months the patient experiences dysuria. He denies difficulty urinating, urinary urgency or frequency. No blood in the urine No penile discharge. Has not been sexually active in several months.  He actually started to exercise again eating healthier 3 days ago, since then "symptoms completely stopped".  Review of Systems Denies fever chills No abdominal pain He did have some bilateral flank pain, mild.   Past Medical History:  Diagnosis Date  . Hypertension   . Morbid obesity (HCC)     Past Surgical History:  Procedure Laterality Date  . NO PAST SURGERIES      Allergies as of 03/15/2020   No Known Allergies     Medication List       Accurate as of March 15, 2020 11:59 PM. If you have any questions, ask your nurse or doctor.        ketoconazole 2 % cream Commonly known as: NIZORAL Apply 1 application topically daily.          Objective:   Physical Exam BP (!) 132/88 (BP Location: Left Arm, Patient Position: Sitting, Cuff Size: Normal)   Pulse (!) 112   Temp 98.2 F (36.8 C) (Oral)   Resp 16   Ht 5\' 6"  (1.676 m)   Wt (!) 237 lb 7 oz (107.7 kg)   SpO2 98%   BMI 38.32 kg/m  General:   Well developed, NAD, BMI noted.  HEENT:  Normocephalic . Face symmetric, atraumatic Lungs:  CTA B Normal respiratory effort, no intercostal retractions, no accessory muscle use. Heart: RRR,  no murmur.  Abdomen:  Not distended, soft, non-tender. No rebound or rigidity. No CVA tenderness GU: Scrotal contents normal, penis without discharge or rash. DRE: Normal sphincter tone, brown stools, prostate not tender Skin: Not pale. Not jaundice Lower extremities: no pretibial edema bilaterally  Neurologic:  alert & oriented X3.  Speech normal, gait appropriate for age and unassisted Psych--  Cognition and judgment  appear intact.  Cooperative with normal attention span and concentration.  Behavior appropriate. No anxious or depressed appearing.     Assessment    Assessment HTN Morbid obesity  Plan: L UTS Had dysuria for a while, no penile discharge, no recently sexually active. Udip showed ketones, blood ( ? Microscopic hematuria), some protein. We will get a UA, urine culture, BMP, CBC, PSA to rule out prostatitis. Further advised with results. HTN?  On no medicines, BP today ok, diastolic BP slightly high, ambulatory BPs when checked normal. Morbid obesity: Started to eat healthy exercise just few days ago, praised. Patient education: Has been hesitant to get the Covid vaccine, pro >> cons discussed RTC CPX 3 months    This visit occurred during the SARS-CoV-2 public health emergency.  Safety protocols were in place, including screening questions prior to the visit, additional usage of staff PPE, and extensive cleaning of exam room while observing appropriate contact time as indicated for disinfecting solutions.

## 2020-03-16 LAB — CBC WITH DIFFERENTIAL/PLATELET
Basophils Absolute: 0 10*3/uL (ref 0.0–0.2)
Basos: 0 %
EOS (ABSOLUTE): 0 10*3/uL (ref 0.0–0.4)
Eos: 0 %
Hematocrit: 43.9 % (ref 37.5–51.0)
Hemoglobin: 15 g/dL (ref 13.0–17.7)
Immature Grans (Abs): 0 10*3/uL (ref 0.0–0.1)
Immature Granulocytes: 0 %
Lymphocytes Absolute: 1.7 10*3/uL (ref 0.7–3.1)
Lymphs: 25 %
MCH: 29.4 pg (ref 26.6–33.0)
MCHC: 34.2 g/dL (ref 31.5–35.7)
MCV: 86 fL (ref 79–97)
Monocytes Absolute: 0.4 10*3/uL (ref 0.1–0.9)
Monocytes: 6 %
Neutrophils Absolute: 4.6 10*3/uL (ref 1.4–7.0)
Neutrophils: 69 %
Platelets: 352 10*3/uL (ref 150–450)
RBC: 5.11 x10E6/uL (ref 4.14–5.80)
RDW: 13.2 % (ref 11.6–15.4)
WBC: 6.7 10*3/uL (ref 3.4–10.8)

## 2020-03-16 LAB — MICROSCOPIC EXAMINATION
Bacteria, UA: NONE SEEN
Casts: NONE SEEN /lpf

## 2020-03-16 LAB — URINALYSIS, ROUTINE W REFLEX MICROSCOPIC
Bilirubin, UA: NEGATIVE
Glucose, UA: NEGATIVE
Leukocytes,UA: NEGATIVE
Nitrite, UA: NEGATIVE
RBC, UA: NEGATIVE
Specific Gravity, UA: >=1.03 — AB (ref 1.005–1.030)
Urobilinogen, Ur: 0.2 mg/dL (ref 0.2–1.0)
pH, UA: 6 (ref 5.0–7.5)

## 2020-03-16 LAB — BASIC METABOLIC PANEL
BUN/Creatinine Ratio: 16 (ref 9–20)
BUN: 19 mg/dL (ref 6–24)
CO2: 22 mmol/L (ref 20–29)
Calcium: 9.6 mg/dL (ref 8.7–10.2)
Chloride: 100 mmol/L (ref 96–106)
Creatinine, Ser: 1.2 mg/dL (ref 0.76–1.27)
GFR calc Af Amer: 83 mL/min/{1.73_m2} (ref >59)
GFR calc non Af Amer: 72 mL/min/{1.73_m2} (ref >59)
Glucose: 79 mg/dL (ref 65–99)
Potassium: 4.4 mmol/L (ref 3.5–5.2)
Sodium: 140 mmol/L (ref 134–144)

## 2020-03-16 LAB — PSA: Prostate Specific Ag, Serum: 0.9 ng/mL (ref 0.0–4.0)

## 2020-03-16 NOTE — Assessment & Plan Note (Signed)
L UTS Had dysuria for a while, no penile discharge, no recently sexually active. Udip showed ketones, blood ( ? Microscopic hematuria), some protein. We will get a UA, urine culture, BMP, CBC, PSA to rule out prostatitis. Further advised with results. HTN?  On no medicines, BP today ok, diastolic BP slightly high, ambulatory BPs when checked normal. Morbid obesity: Started to eat healthy exercise just few days ago, praised. Patient education: Has been hesitant to get the Covid vaccine, pro >> cons discussed RTC CPX 3 months

## 2020-03-17 LAB — URINE CULTURE: Organism ID, Bacteria: NO GROWTH

## 2020-03-23 ENCOUNTER — Ambulatory Visit: Payer: 59 | Admitting: Internal Medicine

## 2020-08-02 ENCOUNTER — Encounter: Payer: 59 | Admitting: Internal Medicine

## 2020-10-17 ENCOUNTER — Other Ambulatory Visit: Payer: Self-pay

## 2020-10-17 ENCOUNTER — Encounter: Payer: Self-pay | Admitting: Internal Medicine

## 2020-10-17 ENCOUNTER — Ambulatory Visit: Payer: 59 | Admitting: Internal Medicine

## 2020-10-17 VITALS — BP 146/92 | HR 92 | Temp 98.0°F | Resp 16 | Ht 66.0 in | Wt 235.0 lb

## 2020-10-17 DIAGNOSIS — I10 Essential (primary) hypertension: Secondary | ICD-10-CM | POA: Diagnosis not present

## 2020-10-17 LAB — BASIC METABOLIC PANEL
BUN: 13 mg/dL (ref 6–23)
CO2: 32 mEq/L (ref 19–32)
Calcium: 9.3 mg/dL (ref 8.4–10.5)
Chloride: 102 mEq/L (ref 96–112)
Creatinine, Ser: 1.13 mg/dL (ref 0.40–1.50)
GFR: 77.33 mL/min (ref >60.00)
Glucose, Bld: 88 mg/dL (ref 70–99)
Potassium: 4.7 mEq/L (ref 3.5–5.1)
Sodium: 137 mEq/L (ref 135–145)

## 2020-10-17 MED ORDER — TELMISARTAN 40 MG PO TABS: 40.0000 mg | ORAL_TABLET | Freq: Every day | ORAL | 1 refills | Status: DC

## 2020-10-17 NOTE — Progress Notes (Signed)
   Subjective:    Patient ID: [name] [name], male    DOB: 1970-12-15, 50 y.o.   MRN: [id number]  DOS:  10/17/2020 Type of visit - description: acute A week ago noted left subconjunctival hemorrhage, it quickly resolved however he started to check his blood pressure. He has been elevated in the 160s to 140s.  He denies any chest pain or difficulty breathing No lower extremity edema No palpitations He exercise regularly but diet needs to improve, has noticed some weight gain lately.   BP Readings from Last 3 Encounters:  10/17/20 (!) 146/92  03/15/20 (!) 132/88  07/23/18 (!) 158/88    Review of Systems See above   Past Medical History:  Diagnosis Date  . Hypertension   . Morbid obesity (HCC)     Past Surgical History:  Procedure Laterality Date  . NO PAST SURGERIES      Allergies as of 10/17/2020   No Known Allergies     Medication List       Accurate as of October 17, 2020 11:59 PM. If you have any questions, ask your nurse or doctor.        ketoconazole 2 % cream Commonly known as: NIZORAL Apply 1 application topically daily.   telmisartan 40 MG tablet Commonly known as: MICARDIS Take 1 tablet (40 mg total) by mouth daily. Started by: Willow Ora, MD          Objective:   Physical Exam BP (!) 146/92 (BP Location: Left Arm, Patient Position: Sitting, Cuff Size: Normal)   Pulse 92   Temp 98 F (36.7 C) (Oral)   Resp 16   Ht 5\' 6"  (1.676 m)   Wt 235 lb (106.6 kg)   SpO2 99%   BMI 37.93 kg/m  General:   Well developed, NAD, BMI noted. HEENT:  Normocephalic . Face symmetric, atraumatic Lungs:  CTA B Normal respiratory effort, no intercostal retractions, no accessory muscle use. Heart: RRR,  no murmur.  Lower extremities: no pretibial edema bilaterally  Skin: Not pale. Not jaundice Neurologic:  alert & oriented X3.  Speech normal, gait appropriate for age and unassisted Psych--  Cognition and judgment appear intact.  Cooperative with normal attention  span and concentration.  Behavior appropriate. No anxious or depressed appearing.      Assessment     Assessment HTN Morbid obesity  Plan: HTN: Has a history of HTN, at some point to Lotrel 10-20, seems like he needs to go back in medication. Plan: Telmisartan 40 mg daily, monitor BPs, BMP today and recheck in 2 weeks. RTC for CPX 3 months   This visit occurred during the SARS-CoV-2 public health emergency.  Safety protocols were in place, including screening questions prior to the visit, additional usage of staff PPE, and extensive cleaning of exam room while observing appropriate contact time as indicated for disinfecting solutions.

## 2020-10-17 NOTE — Patient Instructions (Addendum)
Start telmisartan 40 mg: 1 tablet a day  Check the  blood pressure daily  BP GOAL is between 110/65 and  135/85. If it is consistently higher or lower, let me know       GO TO THE LAB : Get the blood work     GO TO THE FRONT DESK, PLEASE SCHEDULE YOUR APPOINTMENTS Come back for for blood work again in 2 weeks    Come back for  A physical exam in 3 months

## 2020-10-18 NOTE — Assessment & Plan Note (Signed)
HTN: Has a history of HTN, at some point to Lotrel 10-20, seems like he needs to go back in medication. Plan: Telmisartan 40 mg daily, monitor BPs, BMP today and recheck in 2 weeks. RTC for CPX 3 months

## 2020-10-31 ENCOUNTER — Other Ambulatory Visit: Payer: 59

## 2020-10-31 ENCOUNTER — Telehealth: Payer: Self-pay

## 2020-10-31 NOTE — Telephone Encounter (Signed)
Pt needing to reschedule lab appt for today- he has not been taking medicine in allotted time.   Telephone: [phone]

## 2020-10-31 NOTE — Telephone Encounter (Signed)
Called Patient to r/s lab appt

## 2020-12-22 ENCOUNTER — Other Ambulatory Visit: Payer: Self-pay | Admitting: Internal Medicine

## 2021-01-18 ENCOUNTER — Encounter: Payer: 59 | Admitting: Internal Medicine

## 2021-06-09 ENCOUNTER — Telehealth: Payer: 59 | Admitting: Family

## 2021-06-09 DIAGNOSIS — I10 Essential (primary) hypertension: Secondary | ICD-10-CM | POA: Diagnosis not present

## 2021-06-09 MED ORDER — TELMISARTAN 80 MG PO TABS: 80.0000 mg | ORAL_TABLET | Freq: Every day | ORAL | 0 refills | Status: DC

## 2021-06-09 NOTE — Progress Notes (Signed)
Virtual Visit Consent   [name] [name], you are scheduled for a virtual visit with a [facility] provider today.     Just as with appointments in the office, your consent must be obtained to participate.  Your consent will be active for this visit and any virtual visit you may have with one of our providers in the next 365 days.     If you have a MyChart account, a copy of this consent can be sent to you electronically.  All virtual visits are billed to your insurance company just like a traditional visit in the office.    As this is a virtual visit, video technology does not allow for your provider to perform a traditional examination.  This may limit your provider's ability to fully assess your condition.  If your provider identifies any concerns that need to be evaluated in person or the need to arrange testing (such as labs, EKG, etc.), we will make arrangements to do so.     Although advances in technology are sophisticated, we cannot ensure that it will always work on either your end or our end.  If the connection with a video visit is poor, the visit may have to be switched to a telephone visit.  With either a video or telephone visit, we are not always able to ensure that we have a secure connection.     I need to obtain your verbal consent now.   Are you willing to proceed with your visit today?    [name] [name] has provided verbal consent on 06/09/2021 for a virtual visit (video or telephone).   Jannifer Rodney, FNP   Date: 06/09/2021 4:11 PM   Virtual Visit via Video Note   I, Jannifer Rodney, connected with  [name] [name]  ([id number], October 10, 1970) on 06/09/21 at  4:00 PM EDT by a video-enabled telemedicine application and verified that I am speaking with the correct person using two identifiers.  Location: Patient: Virtual Visit Location Patient: Work Provider: Virtual Visit Location Provider: Home   I discussed the limitations of evaluation and management by telemedicine and the  availability of in person appointments. The patient expressed understanding and agreed to proceed.    History of Present Illness: [name] [name] is a 50 y.o. who identifies as a male who was assigned male at birth, and is being seen today for elevated blood pressure. He reports he went to get his eyes checked and his BP was 161/101. He had not taken his Telisartan 40 mg. He did go home and take his medication and his BP decreased to 139/90. He reports the next day he checked his BP on his medications and it was 149/91.  HPI: Hypertension This is a chronic problem. The current episode started more than 1 year ago. The problem has been waxing and waning since onset. Associated symptoms include headaches. Pertinent negatives include no malaise/fatigue, peripheral edema or shortness of breath. Past treatments include angiotensin blockers. The current treatment provides mild improvement.   Problems:  Patient Active Problem List   Diagnosis Date Noted   PCP NOTES >>>> 07/25/2018   Morbid obesity (HCC)    Erectile dysfunction 04/26/2012   Annual physical exam 08/15/2011   HYPERTENSION 05/12/2007    Allergies: No Known Allergies Medications:  Current Outpatient Medications:    telmisartan (MICARDIS) 80 MG tablet, Take 1 tablet (80 mg total) by mouth daily., Disp: 30 tablet, Rfl: 0  Observations/Objective: Patient is well-developed, well-nourished in no acute distress.  Resting comfortably  Head is normocephalic, atraumatic.  No labored breathing.  Speech is clear and coherent with logical content.  Patient is alert and oriented at baseline.    Assessment and Plan: 1. Primary hypertension - telmisartan (MICARDIS) 80 MG tablet; Take 1 tablet (80 mg total) by mouth daily.  Dispense: 30 tablet; Refill: 0 Will increase Telmisartan to 80 mg from 40 mg He will call his PCP for follow up in the next 2 weeks for BMP and BP check He will continue to monitor BP and if <100/60 he will decrease medication.   -Dash diet information given -Exercise encouraged - Stress Management  -Continue current meds    Follow Up Instructions: I discussed the assessment and treatment plan with the patient. The patient was provided an opportunity to ask questions and all were answered. The patient agreed with the plan and demonstrated an understanding of the instructions.  A copy of instructions were sent to the patient via MyChart unless otherwise noted below.     The patient was advised to call back or seek an in-person evaluation if the symptoms worsen or if the condition fails to improve as anticipated.  Time:  I spent 7 minutes with the patient via telehealth technology discussing the above problems/concerns.    Jannifer Rodney, FNP

## 2021-06-09 NOTE — Patient Instructions (Signed)

## 2021-06-24 ENCOUNTER — Ambulatory Visit: Payer: 59 | Admitting: Internal Medicine

## 2021-06-25 ENCOUNTER — Other Ambulatory Visit: Payer: Self-pay

## 2021-06-25 ENCOUNTER — Encounter (HOSPITAL_BASED_OUTPATIENT_CLINIC_OR_DEPARTMENT_OTHER): Payer: Self-pay

## 2021-06-25 DIAGNOSIS — Z79899 Other long term (current) drug therapy: Secondary | ICD-10-CM | POA: Diagnosis not present

## 2021-06-25 DIAGNOSIS — I10 Essential (primary) hypertension: Secondary | ICD-10-CM | POA: Insufficient documentation

## 2021-06-25 NOTE — ED Triage Notes (Signed)
Pt presents to ED for possible HTN. Pt reports BP 169/100's at ophthalmologist 4 weeks ago. PCP increased his BP meds w/no improvement. Pt does report new stressors, states he just started working from home.

## 2021-06-26 ENCOUNTER — Emergency Department (HOSPITAL_BASED_OUTPATIENT_CLINIC_OR_DEPARTMENT_OTHER)
Admission: EM | Admit: 2021-06-26 | Discharge: 2021-06-26 | Disposition: A | Discharge status: Home / Self Care | Payer: 59 | Attending: Emergency Medicine | Admitting: Emergency Medicine

## 2021-06-26 DIAGNOSIS — I10 Essential (primary) hypertension: Secondary | ICD-10-CM

## 2021-06-26 MED ORDER — AMLODIPINE BESYLATE 5 MG PO TABS: 5.0000 mg | ORAL_TABLET | Freq: Every day | ORAL | 0 refills | Status: DC

## 2021-06-26 MED ORDER — AMLODIPINE BESYLATE 5 MG PO TABS
5.0000 mg | ORAL_TABLET | Freq: Once | ORAL | Status: AC
  Administered 2021-06-26: 01:00:00 5 mg via ORAL
  Filled 2021-06-26: qty 1

## 2021-06-26 NOTE — Discharge Instructions (Addendum)
You were seen today for high blood pressure.  Add Norvasc to her regimen.  Follow-up with your primary doctor for blood pressure recheck and titration of your medication.  Keep a log of your blood pressures.  Also note any significant symptoms.  If you develop chest pain, shortness of breath, headaches, you should be reevaluated.

## 2021-06-26 NOTE — ED Provider Notes (Signed)
[facility] EMERGENCY DEPT Provider Note   CSN: [id number] Arrival date & time: 06/25/21  2257     History Chief Complaint  Patient presents with   Hypertension    [name] [name] is a 50 y.o. male.  HPI     This is a 50 year old male with a history of hypertension and obesity who presents with concerns for high blood pressure.  Patient states that he was at work earlier this evening and was stressed.  He noticed an increase pulse rate and was concern for high blood pressure.  He did not have any specific symptoms at that time but "just did not feel right and wanted to get checked out."  He reports that he occasionally has some dizziness or headache.  No chest pain.  Recently had medications changed at the end of October.  He is currently taking telmisartan for his blood pressure.  He is currently asymptomatic.  Past Medical History:  Diagnosis Date   Hypertension    Morbid obesity [facility])     Patient Active Problem List   Diagnosis Date Noted   PCP NOTES >>>> 07/25/2018   Morbid obesity (HCC)    Erectile dysfunction 04/26/2012   Annual physical exam 08/15/2011   HYPERTENSION 05/12/2007    Past Surgical History:  Procedure Laterality Date   NO PAST SURGERIES         Family History  Problem Relation Age of Onset   Hypertension Mother    Prostate cancer Other        dx in his 88s   Diabetes Other    Colon cancer Neg Hx    CAD Neg Hx     Social History   Tobacco Use   Smoking status: Never   Smokeless tobacco: Never  Substance Use Topics   Alcohol use: No   Drug use: No    Home Medications Prior to Admission medications   Medication Sig Start Date End Date Taking? Authorizing Provider  amLODipine (NORVASC) 5 MG tablet Take 1 tablet (5 mg total) by mouth daily. 06/26/21  Yes Casara Perrier, Mayer Masker, MD  telmisartan (MICARDIS) 80 MG tablet Take 1 tablet (80 mg total) by mouth daily. 06/09/21   Junie Spencer, FNP    Allergies    Patient has no  known allergies.  Review of Systems   Review of Systems  Constitutional:  Negative for fever.  Respiratory:  Negative for shortness of breath.   Cardiovascular:  Negative for chest pain.  Gastrointestinal:  Negative for abdominal pain.  Neurological:  Negative for dizziness and headaches.  All other systems reviewed and are negative.  Physical Exam Updated Vital Signs BP (!) 166/99 (BP Location: Right Arm)   Pulse 89   Temp 98.3 F (36.8 C)   Resp 16   SpO2 99%   Physical Exam Vitals and nursing note reviewed.  Constitutional:      Appearance: He is well-developed. He is obese. He is not ill-appearing.  HENT:     Head: Normocephalic and atraumatic.     Nose: Nose normal.     Mouth/Throat:     Mouth: Mucous membranes are moist.  Eyes:     Pupils: Pupils are equal, round, and reactive to light.  Cardiovascular:     Rate and Rhythm: Normal rate and regular rhythm.     Heart sounds: Normal heart sounds. No murmur heard. Pulmonary:     Effort: Pulmonary effort is normal. No respiratory distress.     Breath sounds: Normal  breath sounds. No wheezing.  Abdominal:     Palpations: Abdomen is soft.     Tenderness: There is no abdominal tenderness. There is no rebound.  Musculoskeletal:     Cervical back: Neck supple.     Right lower leg: No edema.     Left lower leg: No edema.  Skin:    General: Skin is warm and dry.  Neurological:     Mental Status: He is alert and oriented to person, place, and time.  Psychiatric:        Mood and Affect: Mood normal.    ED Results / Procedures / Treatments   Labs (all labs ordered are listed, but only abnormal results are displayed) Labs Reviewed - No data to display  EKG EKG Interpretation  Date/Time:  Wednesday June 26 2021 00:53:49 EST Ventricular Rate:  83 PR Interval:  166 QRS Duration: 96 QT Interval:  375 QTC Calculation: 441 R Axis:   -29 Text Interpretation: Sinus rhythm Borderline left axis deviation Abnormal  R-wave progression, early transition Confirmed by Ross Marcus ([id number]) on 06/26/2021 1:11:57 AM  Radiology No results found.  Procedures Procedures   Medications Ordered in ED Medications  amLODipine (NORVASC) tablet 5 mg (5 mg Oral Given 06/26/21 2355)    ED Course  I have reviewed the triage vital signs and the nursing notes.  Pertinent labs & imaging results that were available during my care of the patient were reviewed by me and considered in my medical decision making (see chart for details).    MDM Rules/Calculators/A&P                           Patient presents with concern for high blood pressure.  Initial blood pressure 185/113.  Pulse rate initially documented at 133.  On my evaluation, pulse rate is in the 90s.  EKG without acute ischemic or arrhythmic changes.  Patient reports increased anxiety and stress at work.  He is relatively otherwise asymptomatic although he does endorse occasional headaches and dizziness.  We had a long discussion.  He is neurologically intact and his physical exam is reassuring.  Recommend that he follow-up closely with his primary physician for titration of blood pressure medication.  It is likely reasonable to add Norvasc at this time as I feel that this is low risk.  I did recommend that he keep a blood pressure diary with documented readings and any symptoms that he may have.  Additionally, he was advised to follow-up if he has any new symptoms including chest pain, shortness of breath, headaches.  Patient stated understanding.  After history, exam, and medical workup I feel the patient has been appropriately medically screened and is safe for discharge home. Pertinent diagnoses were discussed with the patient. Patient was given return precautions.  Final Clinical Impression(s) / ED Diagnoses Final diagnoses:  Primary hypertension    Rx / DC Orders ED Discharge Orders          Ordered    amLODipine (NORVASC) 5 MG tablet  Daily         06/26/21 0039             Shon Baton, MD 06/26/21 [phone]

## 2021-07-05 ENCOUNTER — Ambulatory Visit: Payer: 59 | Admitting: Internal Medicine

## 2021-07-09 ENCOUNTER — Other Ambulatory Visit: Payer: Self-pay | Admitting: Internal Medicine

## 2021-07-09 DIAGNOSIS — I10 Essential (primary) hypertension: Secondary | ICD-10-CM

## 2021-07-09 MED ORDER — TELMISARTAN 80 MG PO TABS: 80.0000 mg | ORAL_TABLET | Freq: Every day | ORAL | 0 refills | Status: DC

## 2021-07-10 ENCOUNTER — Encounter: Payer: Self-pay | Admitting: Internal Medicine

## 2021-07-19 ENCOUNTER — Other Ambulatory Visit: Payer: Self-pay | Admitting: Internal Medicine

## 2021-07-19 DIAGNOSIS — I10 Essential (primary) hypertension: Secondary | ICD-10-CM

## 2021-07-23 ENCOUNTER — Other Ambulatory Visit: Payer: Self-pay | Admitting: Family

## 2021-07-23 DIAGNOSIS — I10 Essential (primary) hypertension: Secondary | ICD-10-CM

## 2021-07-25 ENCOUNTER — Encounter: Payer: Self-pay | Admitting: Internal Medicine

## 2021-07-25 ENCOUNTER — Ambulatory Visit (INDEPENDENT_AMBULATORY_CARE_PROVIDER_SITE_OTHER): Payer: 59 | Admitting: Internal Medicine

## 2021-07-25 VITALS — BP 132/82 | HR 117 | Temp 98.3°F | Resp 18 | Ht 66.0 in | Wt 239.2 lb

## 2021-07-25 DIAGNOSIS — R739 Hyperglycemia, unspecified: Secondary | ICD-10-CM

## 2021-07-25 DIAGNOSIS — F439 Reaction to severe stress, unspecified: Secondary | ICD-10-CM | POA: Diagnosis not present

## 2021-07-25 DIAGNOSIS — I10 Essential (primary) hypertension: Secondary | ICD-10-CM

## 2021-07-25 DIAGNOSIS — R0683 Snoring: Secondary | ICD-10-CM | POA: Diagnosis not present

## 2021-07-25 MED ORDER — TELMISARTAN 80 MG PO TABS: 80.0000 mg | ORAL_TABLET | Freq: Every day | ORAL | 1 refills | Status: DC

## 2021-07-25 MED ORDER — AMLODIPINE BESYLATE 5 MG PO TABS: 5.0000 mg | ORAL_TABLET | Freq: Every day | ORAL | 1 refills | Status: DC

## 2021-07-25 NOTE — Progress Notes (Signed)
Subjective:    Patient ID: [name] [name], male    DOB: 07/02/1971, 50 y.o.   MRN: [id number]  DOS:  07/25/2021 Type of visit - description: ED follow-up  Last visit with me 10-2020.  Seen at the ER 06/26/2021 Reason for  the visit  ---  palpitations, some HAs, he was concerned about his BP and liked to be checked. All of the above in the context of sever-acute stress at work   At the ER, BP was 166/99.  Physical exam was benign, EKG no acute changes.  Since then, stress is resolved and he is completely asymptomatic.  Few  weeks ago, he changed completely his lifestyle, he is going to the gym regularly. Ambulatory BPs are great since then.  Denies chest pain no difficulty breathing No lower extremity edema  Snoring: He is concerned about this issue, + FH OSA. Denies any fatigue.  Next  Review of Systems See above   Past Medical History:  Diagnosis Date   Hypertension    Morbid obesity [facility])     Past Surgical History:  Procedure Laterality Date   NO PAST SURGERIES      Allergies as of 07/25/2021   No Known Allergies      Medication List        Accurate as of July 25, 2021  3:52 PM. If you have any questions, ask your nurse or doctor.          amLODipine 5 MG tablet Commonly known as: NORVASC Take 1 tablet (5 mg total) by mouth daily.   telmisartan 80 MG tablet Commonly known as: MICARDIS Take 1 tablet (80 mg total) by mouth daily.           Objective:   Physical Exam BP 132/82 (BP Location: Left Arm, Patient Position: Sitting, Cuff Size: Normal)   Pulse (!) 117   Temp 98.3 F (36.8 C) (Oral)   Resp 18   Ht 5\' 6"  (1.676 m)   Wt 239 lb 4 oz (108.5 kg)   SpO2 98%   BMI 38.62 kg/m  General:   Well developed, NAD, BMI noted. HEENT:  Normocephalic . Face symmetric, atraumatic. Throat crowded otherwise normal Lungs:  CTA B Normal respiratory effort, no intercostal retractions, no accessory muscle use. Heart: RRR,  no murmur.  Lower  extremities: no pretibial edema bilaterally  Skin: Not pale. Not jaundice Neurologic:  alert & oriented X3.  Speech normal, gait appropriate for age and unassisted Psych--  Cognition and judgment appear intact.  Cooperative with normal attention span and concentration.  Behavior appropriate. No anxious or depressed appearing.      Assessment    Assessment HTN Morbid obesity  Plan: HTN: Good compliance with amlodipine and olmesartan, recently went to the ER because he was very stressed, BP was elevated, EKG normal, since then stress has resolved, he is completely asymptomatic, has changed completely his lifestyle, his ambulatory BPs are well within normal. Plan: Continue same medications, RF sent, check a CMP and FLP Hyperglycemia: A1c was 6.3 back in 2019, check A1c Snoring: reports he snores , his  father and brother have OSA.  On exam, [name] [name]'s throat is crowded. Epworth scale is borderline positive at 9 he however feels great at this point. D/w pt referral for OSA r/o vs reassess for OSA in few months since he is doing so well with lifestyle we agreed to reassess in few months. Morbid Obesity: changed his life style completely in the last few weeks, praised!  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 RTC 3 months CPX     This visit occurred during the SARS-CoV-2 public health emergency.  Safety protocols were in place, including screening questions prior to the visit, additional usage of staff PPE, and extensive cleaning of exam room while observing appropriate contact time as indicated for disinfecting solutions.

## 2021-07-25 NOTE — Patient Instructions (Addendum)
Continue with your healthy lifestyle!.  Check the  blood pressure weekly BP GOAL is between 110/65 and  135/85. If it is consistently higher or lower, let me know  Proceed with a COVID booster  GO TO THE FRONT DESK, PLEASE SCHEDULE YOUR APPOINTMENTS Come back for a physical exam in 3 months

## 2021-07-26 ENCOUNTER — Telehealth: Payer: Self-pay | Admitting: Internal Medicine

## 2021-07-26 DIAGNOSIS — R7989 Other specified abnormal findings of blood chemistry: Secondary | ICD-10-CM

## 2021-07-26 DIAGNOSIS — R739 Hyperglycemia, unspecified: Secondary | ICD-10-CM | POA: Insufficient documentation

## 2021-07-26 DIAGNOSIS — R0683 Snoring: Secondary | ICD-10-CM | POA: Insufficient documentation

## 2021-07-26 LAB — COMPREHENSIVE METABOLIC PANEL
ALT: 89 U/L — ABNORMAL HIGH (ref 0–53)
AST: 239 U/L — ABNORMAL HIGH (ref 0–37)
Albumin: 4.2 g/dL (ref 3.5–5.2)
Alkaline Phosphatase: 54 U/L (ref 39–117)
BUN: 15 mg/dL (ref 6–23)
CO2: 27 mEq/L (ref 19–32)
Calcium: 9.2 mg/dL (ref 8.4–10.5)
Chloride: 103 mEq/L (ref 96–112)
Creatinine, Ser: 1.09 mg/dL (ref 0.40–1.50)
GFR: 80.31 mL/min (ref >60.00)
Glucose, Bld: 74 mg/dL (ref 70–99)
Potassium: 4.1 mEq/L (ref 3.5–5.1)
Sodium: 135 mEq/L (ref 135–145)
Total Bilirubin: 0.6 mg/dL (ref 0.2–1.2)
Total Protein: 7.4 g/dL (ref 6.0–8.3)

## 2021-07-26 LAB — HEMOGLOBIN A1C: Hgb A1c MFr Bld: 5.9 % (ref 4.6–6.5)

## 2021-07-26 LAB — LIPID PANEL
Cholesterol: 168 mg/dL (ref 0–200)
HDL: 40.7 mg/dL (ref >39.00)
LDL Cholesterol: 115 mg/dL — ABNORMAL HIGH (ref 0–99)
NonHDL: 127.19
Total CHOL/HDL Ratio: 4
Triglycerides: 62 mg/dL (ref 0.0–149.0)
VLDL: 12.4 mg/dL (ref 0.0–40.0)

## 2021-07-26 NOTE — Assessment & Plan Note (Signed)
HTN: Good compliance with amlodipine and olmesartan, recently went to the ER because he was very stressed, BP was elevated, EKG normal, since then stress has resolved, he is completely asymptomatic, has changed completely his lifestyle, his ambulatory BPs are well within normal. Plan: Continue same medications, RF sent, check a CMP and FLP Hyperglycemia: A1c was 6.3 back in 2019, check A1c Snoring: reports he snores , his  father and brother have OSA.  On exam, Zacharey's throat is crowded. Epworth scale is borderline positive at 9 he however feels great at this point. D/w pt referral for OSA r/o vs reassess for OSA in few months since he is doing so well with lifestyle we agreed to reassess in few months. Morbid Obesity: changed his life style completely in the last few weeks, praised! 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 RTC 3 months CPX

## 2021-07-26 NOTE — Telephone Encounter (Signed)
Pt. Called and stated he saw his lab results in his mychart and he has some concerns from what he saw. I informed him that once the provider reviews the labs they will contact him regarding results. He just wanted to make someone aware that he is concerned regarding his results.

## 2021-07-28 NOTE — Telephone Encounter (Signed)
Advise patient: - blood sugar test A1c is 5.9, looks good, better than before. - Liver test are elevated, common reasons for that are obesity, increased excessive Tylenol use or alcohol.  -His cholesterol is okay.  PLAN -Definitely work on weight loss - If he is taking Tylenol or alcohol: Avoid. -Get ultrasound of the abdomen, DX increased LFTs -Labs in a month: LFT panel, hepatitis C,Hepatitis B core antibody, hepatitis B surface antigen

## 2021-07-29 NOTE — Telephone Encounter (Signed)
Last read by Karlyne Greenspan at 8:41 AM on 07/29/2021.

## 2021-07-29 NOTE — Addendum Note (Signed)
Addended byConrad Lennox D on: 07/29/2021 08:19 AM   Modules accepted: Orders

## 2021-07-30 ENCOUNTER — Other Ambulatory Visit: Payer: Self-pay

## 2021-07-30 ENCOUNTER — Ambulatory Visit (HOSPITAL_BASED_OUTPATIENT_CLINIC_OR_DEPARTMENT_OTHER)
Admission: RE | Admit: 2021-07-30 | Discharge: 2021-07-30 | Disposition: A | Discharge status: Home / Self Care | Payer: 59 | Source: Ambulatory Visit | Attending: Internal Medicine | Admitting: Internal Medicine

## 2021-07-30 DIAGNOSIS — R7989 Other specified abnormal findings of blood chemistry: Secondary | ICD-10-CM | POA: Insufficient documentation

## 2021-08-28 ENCOUNTER — Other Ambulatory Visit: Payer: Self-pay | Admitting: Internal Medicine

## 2021-08-28 DIAGNOSIS — I10 Essential (primary) hypertension: Secondary | ICD-10-CM

## 2021-09-16 ENCOUNTER — Encounter: Payer: Self-pay | Admitting: Internal Medicine

## 2021-09-20 ENCOUNTER — Other Ambulatory Visit: Payer: 59

## 2021-09-23 ENCOUNTER — Other Ambulatory Visit (INDEPENDENT_AMBULATORY_CARE_PROVIDER_SITE_OTHER): Payer: 59

## 2021-09-23 DIAGNOSIS — R7989 Other specified abnormal findings of blood chemistry: Secondary | ICD-10-CM

## 2021-09-24 LAB — HEPATIC FUNCTION PANEL
ALT: 14 U/L (ref 0–53)
AST: 17 U/L (ref 0–37)
Albumin: 4.4 g/dL (ref 3.5–5.2)
Alkaline Phosphatase: 68 U/L (ref 39–117)
Bilirubin, Direct: 0.1 mg/dL (ref 0.0–0.3)
Total Bilirubin: 0.5 mg/dL (ref 0.2–1.2)
Total Protein: 7.4 g/dL (ref 6.0–8.3)

## 2021-09-24 LAB — HEPATITIS C ANTIBODY
Hepatitis C Ab: NONREACTIVE
SIGNAL TO CUT-OFF: 0.1 (ref <1.00)

## 2021-09-24 LAB — HEPATITIS B CORE ANTIBODY, TOTAL: Hep B Core Total Ab: NONREACTIVE

## 2021-09-24 LAB — HEPATITIS B SURFACE ANTIGEN: Hepatitis B Surface Ag: NONREACTIVE

## 2021-09-26 ENCOUNTER — Encounter: Payer: Self-pay | Admitting: Internal Medicine

## 2021-10-16 ENCOUNTER — Encounter: Payer: Self-pay | Admitting: Internal Medicine

## 2022-02-20 IMAGING — US US ABDOMEN LIMITED
1 series · 14 of 25 positions shown · non-contrast
Comparison: None.

CLINICAL DATA: Elevated LFTs.

EXAM:
ULTRASOUND ABDOMEN LIMITED RIGHT UPPER QUADRANT

[Series 1: us abdomen limited · 14 of 44 slices shown]
[im 1/44]
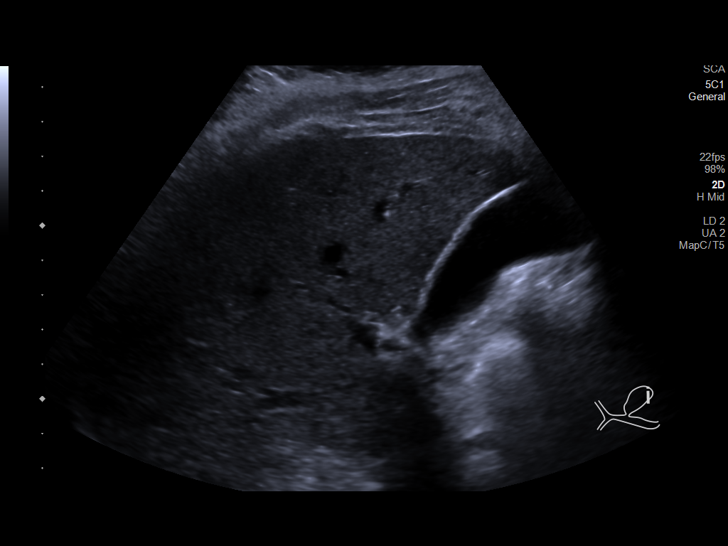
[im 4/44]
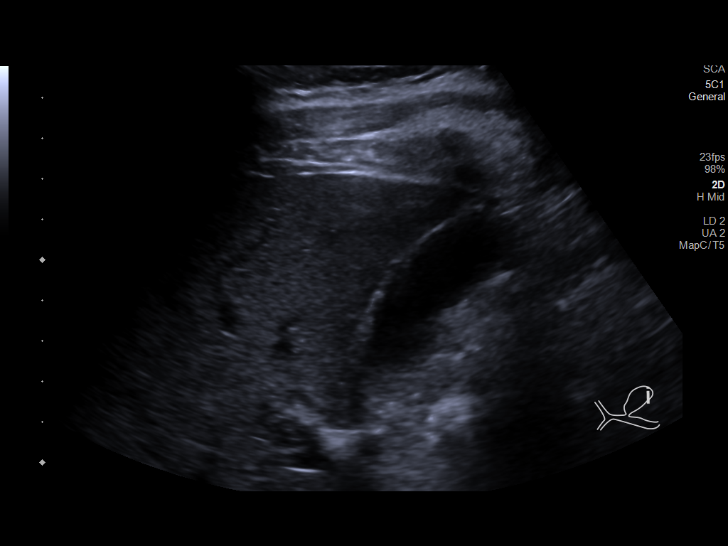
[im 8/44]
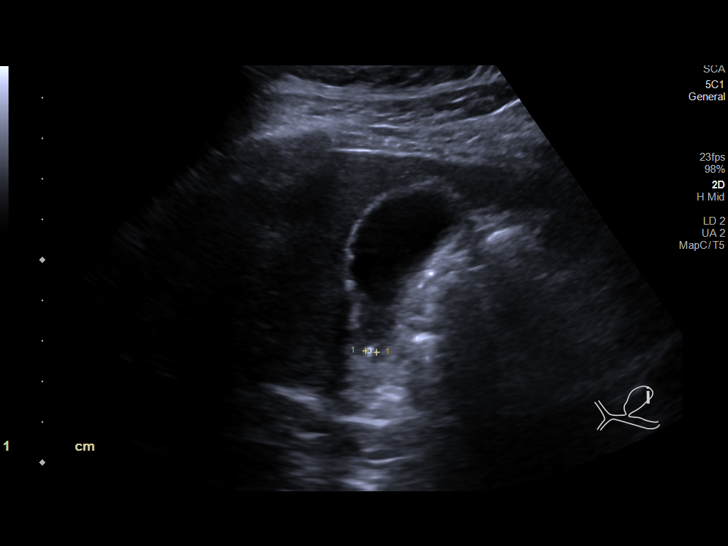
[im 11/44]
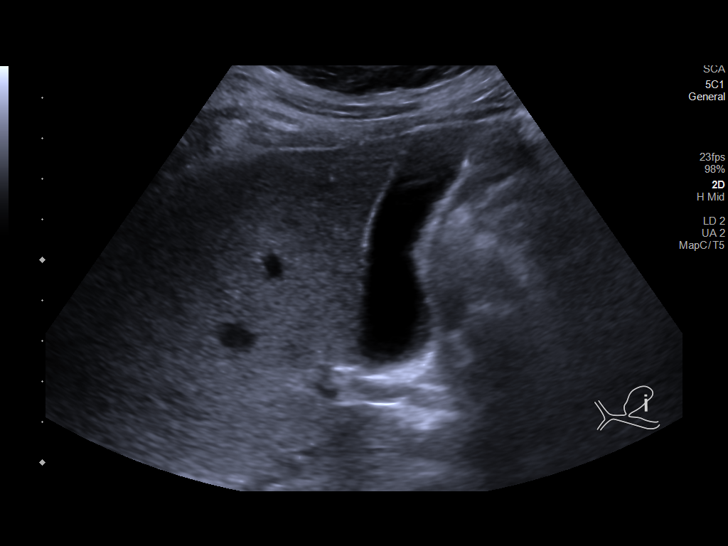
[im 15/44]
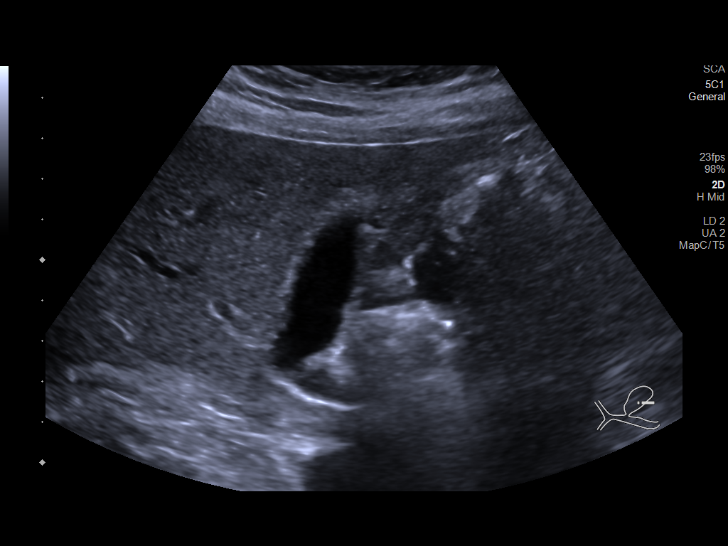
[im 17/44]
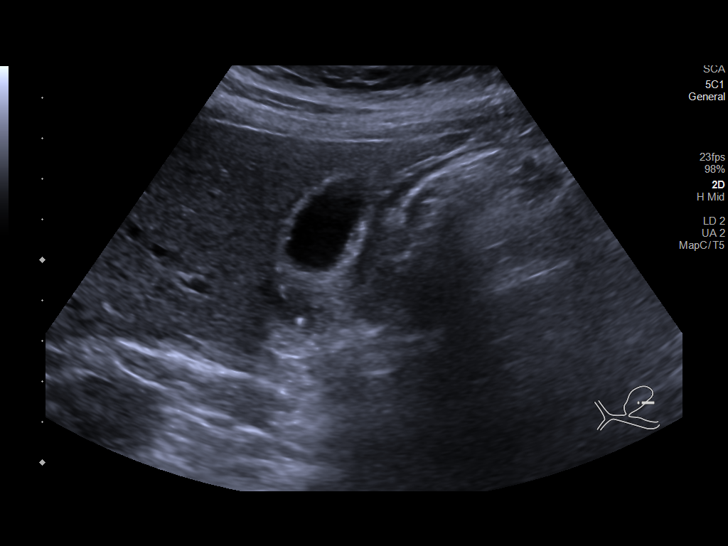
[im 20/44]
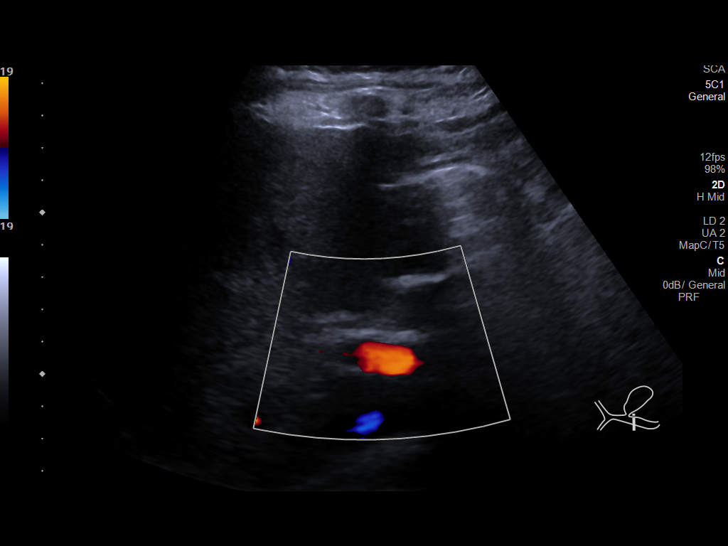
[im 24/44]
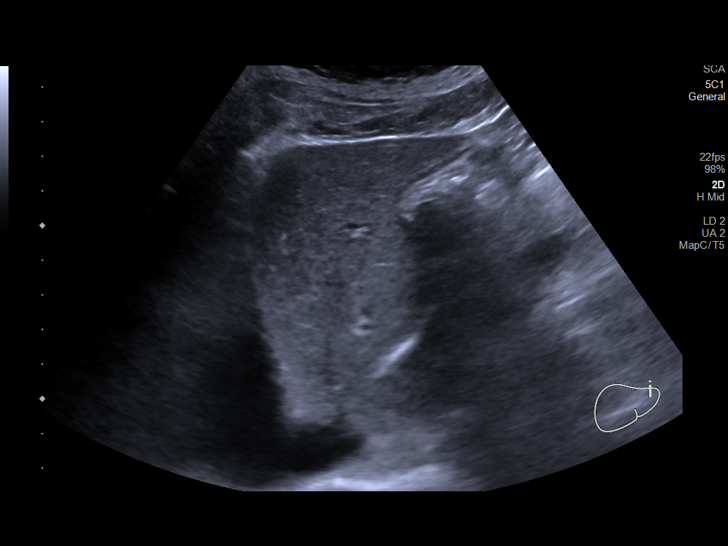
[im 27/44]
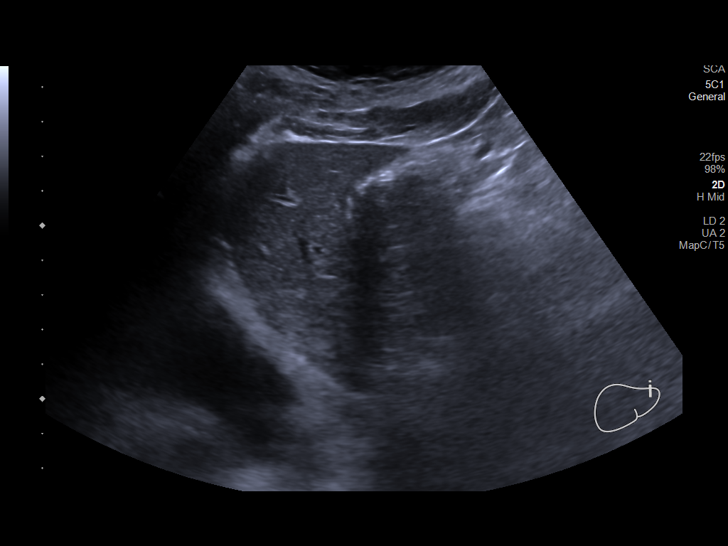
[im 29/44]
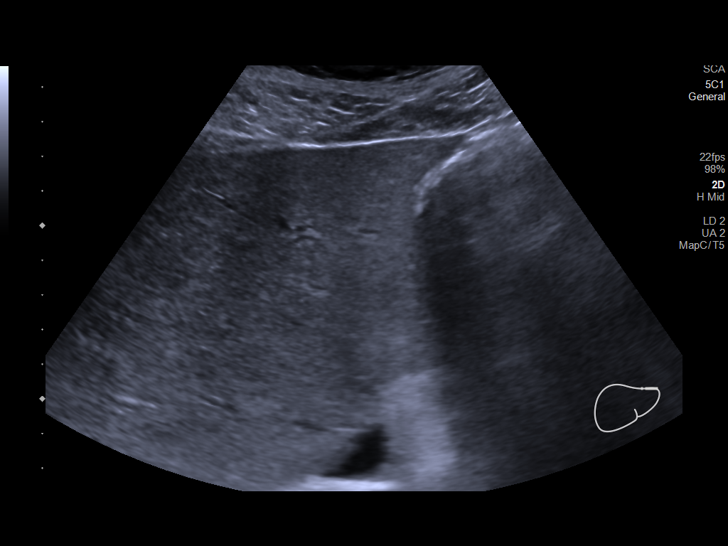
[im 33/44]
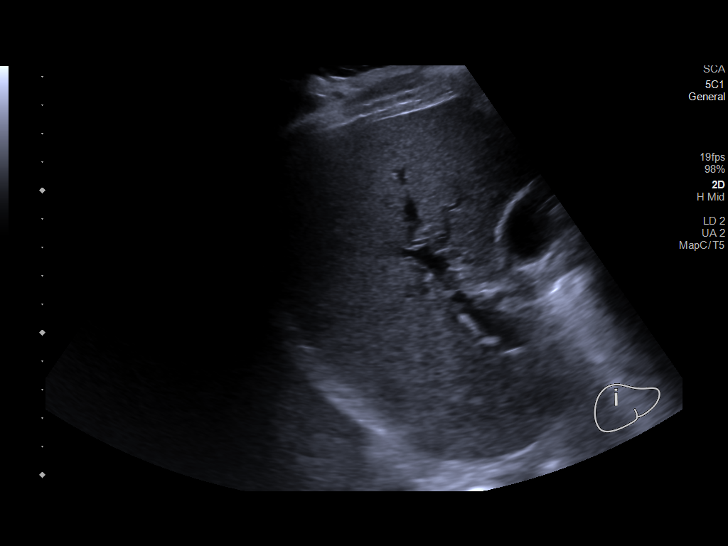
[im 36/44]
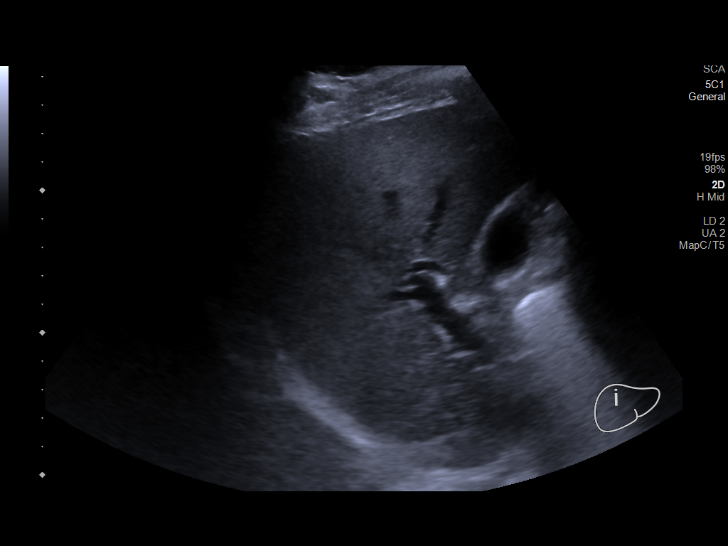
[im 40/44]
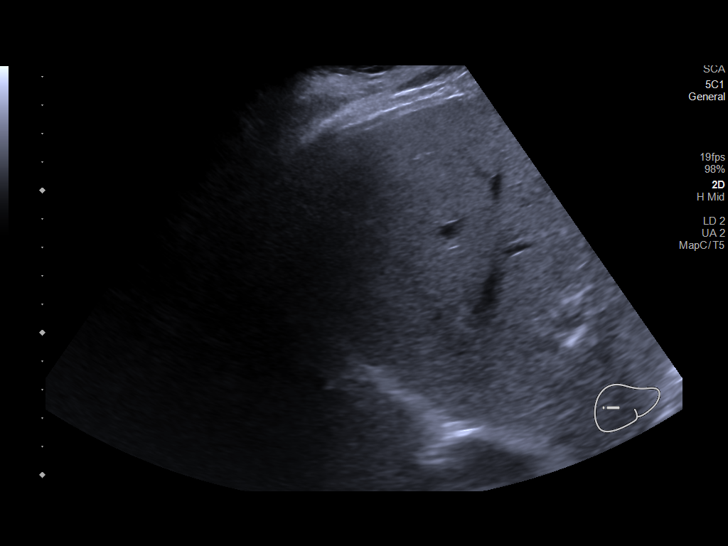
[im 44/44]
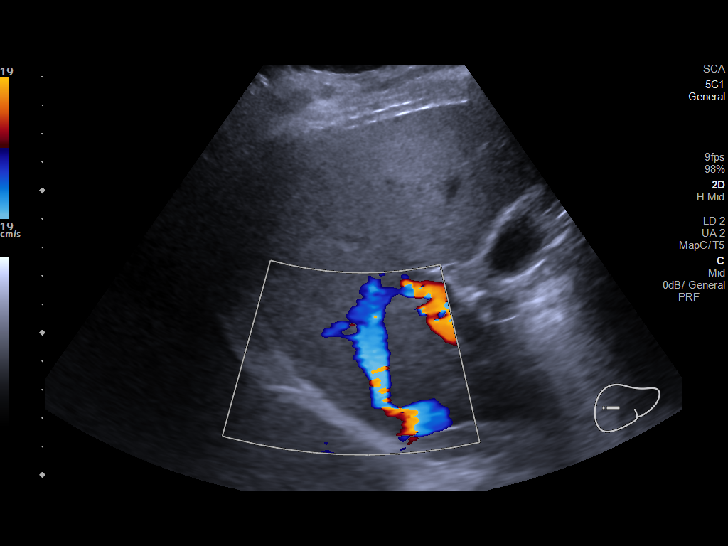

[14 of 25 positions shown; findings below may reference images not displayed]

FINDINGS: Gallbladder:

No gallstone, gallbladder wall thickening, or pericholecystic fluid.
Tiny scattered echogenic foci along the gallbladder wall may
represent areas of adenomyomatosis or tiny polyp measure up to 3 mm.
Negative sonographic Murphy's sign.

Common bile duct:

Diameter: 2 mm

Liver:

There is diffuse increased liver echogenicity most commonly seen in
the setting of fatty infiltration. Superimposed inflammation or
fibrosis is not excluded. Clinical correlation is recommended.
Portal vein is patent on color Doppler imaging with normal direction
of blood flow towards the liver.

Other: None.
IMPRESSION: Mild fatty liver, otherwise unremarkable right upper quadrant
ultrasound.

## 2022-04-02 ENCOUNTER — Other Ambulatory Visit: Payer: Self-pay | Admitting: Internal Medicine

## 2022-05-23 ENCOUNTER — Other Ambulatory Visit: Payer: Self-pay | Admitting: Internal Medicine

## 2022-05-23 ENCOUNTER — Telehealth: Payer: Self-pay | Admitting: Internal Medicine

## 2022-05-23 DIAGNOSIS — I10 Essential (primary) hypertension: Secondary | ICD-10-CM

## 2022-05-23 MED ORDER — AMLODIPINE BESYLATE 5 MG PO TABS: 5.0000 mg | ORAL_TABLET | Freq: Every day | ORAL | 0 refills | Status: DC

## 2022-05-23 NOTE — Telephone Encounter (Signed)
Rx sent 

## 2022-05-23 NOTE — Telephone Encounter (Signed)
Medication: amLODipine (NORVASC) 5 MG tablet [[id number]]   Has the patient contacted their pharmacy? Yes.    Preferred Pharmacy (with phone number or street name):  [id number] - [address], [address] [place], [place] [address] Phone: [phone]  Fax: [phone]    Agent: Please be advised that RX refills may take up to 3 business days. We ask that you follow-up with your pharmacy.

## 2022-05-27 ENCOUNTER — Ambulatory Visit: Payer: 59 | Admitting: Internal Medicine

## 2022-05-27 ENCOUNTER — Encounter: Payer: Self-pay | Admitting: Internal Medicine

## 2022-05-27 ENCOUNTER — Telehealth: Payer: Self-pay | Admitting: Internal Medicine

## 2022-05-27 NOTE — Telephone Encounter (Signed)
PA cancelled by plan.   This medication or product is on your plan's list of covered drugs. Prior authorization is not required at this time. If your pharmacy has questions regarding the processing of your prescription, please have them call the [facility] pharmacy help desk at [phone]. **Please note: This request was submitted electronically. Formulary lowering, tiering exception, cost reduction and/or pre-benefit determination review (including prospective Medicare hospice reviews) requests cannot be requested using this method of submission. Providers contact us at [phone] for further assistance. 

## 2022-05-27 NOTE — Telephone Encounter (Signed)
Pt came in late for his appt (9:08 am) and had to r/s- pt stated was needing his meds since pt did not have meds left- pt was informed rx was sent on 10-06 for amlodipine and telmisartan (MICARDIS) 80 MG tablet to his pharmacy. Pt states had called pharmacy and pharmacy mentioned that insurance did not approve telmisartan, it is needing a prior authorization. Pt would like to know if provider can get the prior authorization so pt can get his meds. Please advise pt at [facility] [phone]

## 2022-05-27 NOTE — Telephone Encounter (Signed)
PA initiated via Covermymeds; KEY: [id number]. Awaiting determination.

## 2022-05-29 ENCOUNTER — Ambulatory Visit: Payer: 59 | Admitting: Internal Medicine

## 2022-05-29 ENCOUNTER — Encounter: Payer: Self-pay | Admitting: Internal Medicine

## 2022-05-29 VITALS — BP 126/84 | HR 101 | Temp 98.1°F | Resp 16 | Ht 66.0 in | Wt 224.1 lb

## 2022-05-29 DIAGNOSIS — I10 Essential (primary) hypertension: Secondary | ICD-10-CM | POA: Diagnosis not present

## 2022-05-29 LAB — CBC WITH DIFFERENTIAL/PLATELET
Basophils Absolute: 0 10*3/uL (ref 0.0–0.1)
Basophils Relative: 0.4 % (ref 0.0–3.0)
Eosinophils Absolute: 0 10*3/uL (ref 0.0–0.7)
Eosinophils Relative: 0 % (ref 0.0–5.0)
HCT: 44.5 % (ref 39.0–52.0)
Hemoglobin: 14.9 g/dL (ref 13.0–17.0)
Lymphocytes Relative: 25.7 % (ref 12.0–46.0)
Lymphs Abs: 1.4 10*3/uL (ref 0.7–4.0)
MCHC: 33.4 g/dL (ref 30.0–36.0)
MCV: 90 fl (ref 78.0–100.0)
Monocytes Absolute: 0.3 10*3/uL (ref 0.1–1.0)
Monocytes Relative: 5 % (ref 3.0–12.0)
Neutro Abs: 3.7 10*3/uL (ref 1.4–7.7)
Neutrophils Relative %: 68.9 % (ref 43.0–77.0)
Platelets: 285 10*3/uL (ref 150.0–400.0)
RBC: 4.94 Mil/uL (ref 4.22–5.81)
RDW: 14 % (ref 11.5–15.5)
WBC: 5.3 10*3/uL (ref 4.0–10.5)

## 2022-05-29 LAB — COMPREHENSIVE METABOLIC PANEL
ALT: 25 U/L (ref 0–53)
AST: 26 U/L (ref 0–37)
Albumin: 4.4 g/dL (ref 3.5–5.2)
Alkaline Phosphatase: 72 U/L (ref 39–117)
BUN: 19 mg/dL (ref 6–23)
CO2: 28 mEq/L (ref 19–32)
Calcium: 9.4 mg/dL (ref 8.4–10.5)
Chloride: 102 mEq/L (ref 96–112)
Creatinine, Ser: 1.11 mg/dL (ref 0.40–1.50)
GFR: 78.11 mL/min (ref >60.00)
Glucose, Bld: 86 mg/dL (ref 70–99)
Potassium: 4.8 mEq/L (ref 3.5–5.1)
Sodium: 137 mEq/L (ref 135–145)
Total Bilirubin: 0.7 mg/dL (ref 0.2–1.2)
Total Protein: 7.7 g/dL (ref 6.0–8.3)

## 2022-05-29 MED ORDER — TELMISARTAN 80 MG PO TABS: 80.0000 mg | ORAL_TABLET | Freq: Every day | ORAL | 1 refills | Status: DC

## 2022-05-29 MED ORDER — AMLODIPINE BESYLATE 5 MG PO TABS: 5.0000 mg | ORAL_TABLET | Freq: Every day | ORAL | 1 refills | Status: DC

## 2022-05-29 NOTE — Assessment & Plan Note (Signed)
HTN: On telmisartan and amlodipine, ambulatory BPs in the 120s.  The pharmacist  allow him to have some extra telmisartan so he has not run out. Plan: RF medications, CMP, CBC. Morbid obesity: He now has a Physiological scientist, exercise daily, has changed his diet, + weight loss noted, feels great.  His personal trainer check his BP regularly >> WNL . Hyperglycemia: Doing great with lifestyle.  Recheck on RTC Snoring: Since he has lost weight, he is not snoring anymore, he is tired from working out but denies somnolence. To have a COVID and flu shot at work RTC CPX 4 to 5 months

## 2022-05-29 NOTE — Progress Notes (Signed)
   Subjective:    Patient ID: [name] [name], male    DOB: 1970/09/22, 51 y.o.   MRN: [id number]  DOS:  05/29/2022 Type of visit - description: Follow-up Since the last office visit is doing well. Has a Physiological scientist, exercises daily. No further snoring. Feeling great.  Wt Readings from Last 3 Encounters:  05/29/22 224 lb 2 oz (101.7 kg)  07/25/21 239 lb 4 oz (108.5 kg)  10/17/20 235 lb (106.6 kg)     Review of Systems See above   Past Medical History:  Diagnosis Date   Hypertension    Morbid obesity ([place])     Past Surgical History:  Procedure Laterality Date   NO PAST SURGERIES      Current Outpatient Medications  Medication Instructions   amLODipine (NORVASC) 5 mg, Oral, Daily   telmisartan (MICARDIS) 80 mg, Oral, Daily       Objective:   Physical Exam BP 126/84   Pulse (!) 101   Temp 98.1 F (36.7 C) (Oral)   Resp 16   Ht 5\' 6"  (1.676 m)   Wt 224 lb 2 oz (101.7 kg)   SpO2 97%   BMI 36.17 kg/m  General: Well developed, NAD, BMI noted Neck: No  thyromegaly  HEENT:  Normocephalic . Face symmetric, atraumatic Lungs:  CTA B Normal respiratory effort, no intercostal retractions, no accessory muscle use. Heart: RRR,  no murmur.  Abdomen:  Not distended, soft, non-tender. No rebound or rigidity.   Lower extremities: no pretibial edema bilaterally  Skin: Exposed areas without rash. Not pale. Not jaundice Neurologic:  alert & oriented X3.  Speech normal, gait appropriate for age and unassisted Strength symmetric and appropriate for age.  Psych: Cognition and judgment appear intact.  Cooperative with normal attention span and concentration.  Behavior appropriate. No anxious or depressed appearing.     Assessment     Assessment HTN Morbid obesity  PLAN HTN: On telmisartan and amlodipine, ambulatory BPs in the 120s.  The pharmacist  allow him to have some extra telmisartan so he has not run out. Plan: RF medications, CMP, CBC. Morbid obesity: He  now has a Physiological scientist, exercise daily, has changed his diet, + weight loss noted, feels great.  His personal trainer check his BP regularly >> WNL . Hyperglycemia: Doing great with lifestyle.  Recheck on RTC Snoring: Since he has lost weight, he is not snoring anymore, he is tired from working out but denies somnolence. To have a COVID and flu shot at work RTC CPX 4 to 5 months

## 2022-05-29 NOTE — Patient Instructions (Addendum)
You are doing great.  Congratulations  Check the  blood pressure regularly BP GOAL is between 110/65 and  135/85. If it is consistently higher or lower, let me know  GO TO THE LAB : Get the blood work     [place], [place] back for a physical exam in 4 to 5 months

## 2022-10-02 ENCOUNTER — Ambulatory Visit (INDEPENDENT_AMBULATORY_CARE_PROVIDER_SITE_OTHER): Payer: 59 | Admitting: Internal Medicine

## 2022-10-02 ENCOUNTER — Encounter: Payer: Self-pay | Admitting: Internal Medicine

## 2022-10-02 VITALS — BP 120/84 | HR 88 | Temp 97.8°F | Resp 16 | Ht 66.0 in | Wt 232.4 lb

## 2022-10-02 DIAGNOSIS — R739 Hyperglycemia, unspecified: Secondary | ICD-10-CM | POA: Diagnosis not present

## 2022-10-02 DIAGNOSIS — Z0000 Encounter for general adult medical examination without abnormal findings: Secondary | ICD-10-CM | POA: Diagnosis not present

## 2022-10-02 DIAGNOSIS — Z114 Encounter for screening for human immunodeficiency virus [HIV]: Secondary | ICD-10-CM | POA: Diagnosis not present

## 2022-10-02 DIAGNOSIS — I10 Essential (primary) hypertension: Secondary | ICD-10-CM

## 2022-10-02 LAB — HEMOGLOBIN A1C: Hgb A1c MFr Bld: 5.8 % (ref 4.6–6.5)

## 2022-10-02 LAB — CBC WITH DIFFERENTIAL/PLATELET
Basophils Absolute: 0 10*3/uL (ref 0.0–0.1)
Basophils Relative: 0.5 % (ref 0.0–3.0)
Eosinophils Absolute: 0 10*3/uL (ref 0.0–0.7)
Eosinophils Relative: 0.2 % (ref 0.0–5.0)
HCT: 44.5 % (ref 39.0–52.0)
Hemoglobin: 15.1 g/dL (ref 13.0–17.0)
Lymphocytes Relative: 32.4 % (ref 12.0–46.0)
Lymphs Abs: 1.6 10*3/uL (ref 0.7–4.0)
MCHC: 34 g/dL (ref 30.0–36.0)
MCV: 89.6 fl (ref 78.0–100.0)
Monocytes Absolute: 0.3 10*3/uL (ref 0.1–1.0)
Monocytes Relative: 6.6 % (ref 3.0–12.0)
Neutro Abs: 3 10*3/uL (ref 1.4–7.7)
Neutrophils Relative %: 60.3 % (ref 43.0–77.0)
Platelets: 304 10*3/uL (ref 150.0–400.0)
RBC: 4.96 Mil/uL (ref 4.22–5.81)
RDW: 13.5 % (ref 11.5–15.5)
WBC: 5.1 10*3/uL (ref 4.0–10.5)

## 2022-10-02 LAB — LIPID PANEL
Cholesterol: 208 mg/dL — ABNORMAL HIGH (ref 0–200)
HDL: 53.2 mg/dL (ref >39.00)
LDL Cholesterol: 144 mg/dL — ABNORMAL HIGH (ref 0–99)
NonHDL: 155.13
Total CHOL/HDL Ratio: 4
Triglycerides: 58 mg/dL (ref 0.0–149.0)
VLDL: 11.6 mg/dL (ref 0.0–40.0)

## 2022-10-02 LAB — COMPREHENSIVE METABOLIC PANEL
ALT: 27 U/L (ref 0–53)
AST: 27 U/L (ref 0–37)
Albumin: 4.3 g/dL (ref 3.5–5.2)
Alkaline Phosphatase: 65 U/L (ref 39–117)
BUN: 17 mg/dL (ref 6–23)
CO2: 29 mEq/L (ref 19–32)
Calcium: 9.6 mg/dL (ref 8.4–10.5)
Chloride: 103 mEq/L (ref 96–112)
Creatinine, Ser: 1.22 mg/dL (ref 0.40–1.50)
GFR: 69.57 mL/min (ref >60.00)
Glucose, Bld: 83 mg/dL (ref 70–99)
Potassium: 4.7 mEq/L (ref 3.5–5.1)
Sodium: 140 mEq/L (ref 135–145)
Total Bilirubin: 0.7 mg/dL (ref 0.2–1.2)
Total Protein: 7.3 g/dL (ref 6.0–8.3)

## 2022-10-02 LAB — TSH: TSH: 0.85 u[IU]/mL (ref 0.35–5.50)

## 2022-10-02 LAB — PSA: PSA: 1 ng/mL (ref 0.10–4.00)

## 2022-10-02 MED ORDER — AMLODIPINE BESYLATE 5 MG PO TABS: 5.0000 mg | ORAL_TABLET | Freq: Every day | ORAL | 3 refills | Status: DC

## 2022-10-02 MED ORDER — TELMISARTAN 80 MG PO TABS: 80.0000 mg | ORAL_TABLET | Freq: Every day | ORAL | 3 refills | Status: DC

## 2022-10-02 NOTE — Patient Instructions (Addendum)
It was good to see you, you are doing great.  Vaccines I recommend:  Covid booster   Continue checking your blood pressure as you are doing BP GOAL is between 110/65 and  135/85. If it is consistently higher or lower, let me know     GO TO THE LAB : Get the blood work     [place], [place] back for   a physical exam in 1 year

## 2022-10-02 NOTE — Progress Notes (Signed)
   Subjective:    Patient ID: [name] [name], male    DOB: [birth year], 52 y.o.   MRN: [id number]  DOS:  10/02/2022 Type of visit - description: cpx Here for CPX Feeling great. Has no concerns  Wt Readings from Last 3 Encounters:  10/02/22 232 lb 6 oz (105.4 kg)  05/29/22 224 lb 2 oz (101.7 kg)  07/25/21 239 lb 4 oz (108.5 kg)   Review of Systems   A 14 point review of systems is negative    Past Medical History:  Diagnosis Date   Hypertension    Morbid obesity ([place])     Past Surgical History:  Procedure Laterality Date   NO PAST SURGERIES     Social History   Socioeconomic History   Marital status: Single    Spouse name: Not on file   Number of children: 0   Years of education: Not on file   Highest education level: Not on file  Occupational History   Occupation: [employer]     Comment: registration   Occupation: [employer] secretary  Tobacco Use   Smoking status: Never   Smokeless tobacco: Never  Substance and Sexual Activity   Alcohol use: No   Drug use: No   Sexual activity: Not on file  Other Topics Concern   Not on file  Social History Narrative   Lives by himself   Social Determinants of Health   Financial Resource Strain: Not on file  Food Insecurity: Not on file  Transportation Needs: Not on file  Physical Activity: Not on file  Stress: Not on file  Social Connections: Not on file  Intimate Partner Violence: Not on file     Current Outpatient Medications  Medication Instructions   amLODipine (NORVASC) 5 mg, Oral, Daily   telmisartan (MICARDIS) 80 mg, Oral, Daily       Objective:   Physical Exam BP 120/84   Pulse 88   Temp 97.8 F (36.6 C) (Oral)   Resp 16   Ht 5' 6"$  (1.676 m)   Wt 232 lb 6 oz (105.4 kg)   SpO2 99%   BMI 37.51 kg/m  General: Well developed, NAD, BMI noted Neck: No  thyromegaly  HEENT:  Normocephalic . Face symmetric, atraumatic Lungs:  CTA B Normal respiratory effort, no intercostal retractions, no accessory  muscle use. Heart: RRR,  no murmur.  Abdomen:  Not distended, soft, non-tender. No rebound or rigidity. DRE: Normal sphincter tone, no stools, prostate symmetric and normal. Lower extremities: no pretibial edema bilaterally  Skin: Exposed areas without rash. Not pale. Not jaundice Neurologic:  alert & oriented X3.  Speech normal, gait appropriate for age and unassisted Strength symmetric and appropriate for age.  Psych: Cognition and judgment appear intact.  Cooperative with normal attention span and concentration.  Behavior appropriate. No anxious or depressed appearing.     Assessment     Assessment HTN Morbid obesity  PLAN Here for CPX HTN: On amlodipine and Micardis, ambulatory BP checks twice a week, always 120/80. Morbid obesity: Doing great, has changed his diet, goes to gym every day at 5 AM, has a Physiological scientist. Praised! Snoring h/o: No snoring at the present time, has plenty of energy. RTC 1 year

## 2022-10-03 ENCOUNTER — Encounter: Payer: Self-pay | Admitting: Internal Medicine

## 2022-10-03 LAB — HIV ANTIBODY (ROUTINE TESTING W REFLEX): HIV 1&2 Ab, 4th Generation: NONREACTIVE

## 2022-10-03 NOTE — Assessment & Plan Note (Signed)
Td 2019 COVID-vaccine: Recommended Yearly flu shot recommended CCS: never had a cscope, 3 options discussed, we agree on IFOB this year and perhaps a colonoscopy next year Prostate ca screening: No symptoms, DRE normal, check PSA Labs:BMP FLP CBC A1c TSH PSA Lifestyle: Excellent, exercise daily, eats healthy.

## 2022-10-03 NOTE — Assessment & Plan Note (Signed)
Here for CPX HTN: On amlodipine and Micardis, ambulatory BP checks twice a week, always 120/80. Morbid obesity: Doing great, has changed his diet, goes to gym every day at 5 AM, has a Physiological scientist. Praised! Snoring h/o: No snoring at the present time, has plenty of energy. RTC 1 year

## 2022-11-28 ENCOUNTER — Encounter: Payer: Self-pay | Admitting: Internal Medicine

## 2023-11-12 ENCOUNTER — Other Ambulatory Visit: Payer: Self-pay | Admitting: Internal Medicine

## 2023-11-12 DIAGNOSIS — I10 Essential (primary) hypertension: Secondary | ICD-10-CM

## 2023-11-18 ENCOUNTER — Encounter: Admitting: Internal Medicine

## 2023-11-18 ENCOUNTER — Encounter: Payer: Self-pay | Admitting: Internal Medicine

## 2023-11-18 ENCOUNTER — Ambulatory Visit (INDEPENDENT_AMBULATORY_CARE_PROVIDER_SITE_OTHER): Admitting: Internal Medicine

## 2023-11-18 VITALS — BP 126/76 | HR 96 | Temp 97.8°F | Resp 16 | Ht 66.0 in | Wt 238.5 lb

## 2023-11-18 DIAGNOSIS — Z0000 Encounter for general adult medical examination without abnormal findings: Secondary | ICD-10-CM

## 2023-11-18 DIAGNOSIS — I10 Essential (primary) hypertension: Secondary | ICD-10-CM

## 2023-11-18 DIAGNOSIS — R739 Hyperglycemia, unspecified: Secondary | ICD-10-CM | POA: Diagnosis not present

## 2023-11-18 DIAGNOSIS — Z1211 Encounter for screening for malignant neoplasm of colon: Secondary | ICD-10-CM

## 2023-11-18 DIAGNOSIS — Z113 Encounter for screening for infections with a predominantly sexual mode of transmission: Secondary | ICD-10-CM | POA: Diagnosis not present

## 2023-11-18 NOTE — Progress Notes (Unsigned)
   Subjective:    Patient ID: [name] [name], male    DOB: Dec 28, 1970, 53 y.o.   MRN: [id number]  DOS:  11/18/2023 Type of visit - description: CPX   Here for CPX. Feeling great. A month ago we will decided to change his diet completely and exercise regularly. Doing both cardio and lifting.  Has lost about 5 pounds. Denies any fatigue, no problems with ED.  Building up  muscle mass.  Wt Readings from Last 3 Encounters:  11/18/23 238 lb 8 oz (108.2 kg)  10/02/22 232 lb 6 oz (105.4 kg)  05/29/22 224 lb 2 oz (101.7 kg)     Review of Systems See above   Past Medical History:  Diagnosis Date   Hypertension    Morbid obesity ([facility])     Past Surgical History:  Procedure Laterality Date   NO PAST SURGERIES      Current Outpatient Medications  Medication Instructions   amLODipine (NORVASC) 5 mg, Oral, Daily   telmisartan (MICARDIS) 80 mg, Oral, Daily       Objective:   Physical Exam BP 126/76   Pulse 96   Temp 97.8 F (36.6 C) (Oral)   Resp 16   Ht 5\' 6"  (1.676 m)   Wt 238 lb 8 oz (108.2 kg)   SpO2 97%   BMI 38.49 kg/m  General: Well developed, NAD, BMI noted Neck: No  thyromegaly  HEENT:  Normocephalic . Face symmetric, atraumatic Lungs:  CTA B Normal respiratory effort, no intercostal retractions, no accessory muscle use. Heart: RRR,  no murmur.  Abdomen:  Not distended, soft, non-tender. No rebound or rigidity.   Lower extremities: no pretibial edema bilaterally  Skin: Exposed areas without rash. Not pale. Not jaundice Neurologic:  alert & oriented X3.  Speech normal, gait appropriate for age and unassisted Strength symmetric and appropriate for age.  Psych: Cognition and judgment appear intact.  Cooperative with normal attention span and concentration.  Behavior appropriate. No anxious or depressed appearing.     Assessment   Assessment HTN Morbid obesity  PLAN Here for CPX Td 2019 Rec: flu shot q fall, and a covid vax if not done in the last 6  m Consider a shingles shot  CCS: never had a cscope, 3 options discussed, elected cologuard  Prostate ca screening: No symptoms,  check PSA Labs:BMP FLP CBC A1c TSH PSA.  Also request STD screening.  Check HIV, hep C and RPR Lifestyle: Excellent, the last month has completely changed his diet and is increasing his exercise.  Also discussed the following. HTN:On amlodipine and telmisartan.  BP today is good, normal ambulatory BPs.  No change. Testosterone check?  I do not see a need to check, he agrees. Morbid obesity: Doing very well.  Encouraged to keep up with his new lifestyle. RTC 1 year  HTN: On amlodipine and Micardis, ambulatory BP checks twice a week, always 120/80. Morbid obesity: Doing great, has changed his diet, goes to gym every day at 5 AM, has a Systems analyst. Praised! Snoring h/o: No snoring at the present time, has plenty of energy. RTC 1 year

## 2023-11-18 NOTE — Patient Instructions (Addendum)
 You are doing great with your changed diet and exercise  Vaccines I recommend: Covid booster  Check the  blood pressure regularly Blood pressure goal:  between 110/65 and  135/85. If it is consistently higher or lower, let me know     GO TO THE LAB : Get the blood work     Please go to the front desk: Schedule a physical exam in 1 year for now.  Come sooner if needed

## 2023-11-19 ENCOUNTER — Encounter: Payer: Self-pay | Admitting: Internal Medicine

## 2023-11-19 LAB — COMPREHENSIVE METABOLIC PANEL WITH GFR
ALT: 18 U/L (ref 0–53)
AST: 23 U/L (ref 0–37)
Albumin: 4.3 g/dL (ref 3.5–5.2)
Alkaline Phosphatase: 55 U/L (ref 39–117)
BUN: 16 mg/dL (ref 6–23)
CO2: 28 meq/L (ref 19–32)
Calcium: 9.3 mg/dL (ref 8.4–10.5)
Chloride: 102 meq/L (ref 96–112)
Creatinine, Ser: 1.18 mg/dL (ref 0.40–1.50)
GFR: 71.84 mL/min (ref >60.00)
Glucose, Bld: 79 mg/dL (ref 70–99)
Potassium: 4.4 meq/L (ref 3.5–5.1)
Sodium: 138 meq/L (ref 135–145)
Total Bilirubin: 0.8 mg/dL (ref 0.2–1.2)
Total Protein: 6.8 g/dL (ref 6.0–8.3)

## 2023-11-19 LAB — LIPID PANEL
Cholesterol: 203 mg/dL — ABNORMAL HIGH (ref 0–200)
HDL: 48.9 mg/dL (ref >39.00)
LDL Cholesterol: 141 mg/dL — ABNORMAL HIGH (ref 0–99)
NonHDL: 154.58
Total CHOL/HDL Ratio: 4
Triglycerides: 66 mg/dL (ref 0.0–149.0)
VLDL: 13.2 mg/dL (ref 0.0–40.0)

## 2023-11-19 LAB — CBC WITH DIFFERENTIAL/PLATELET
Basophils Absolute: 0 10*3/uL (ref 0.0–0.1)
Basophils Relative: 0.7 % (ref 0.0–3.0)
Eosinophils Absolute: 0 10*3/uL (ref 0.0–0.7)
Eosinophils Relative: 0.9 % (ref 0.0–5.0)
HCT: 44.5 % (ref 39.0–52.0)
Hemoglobin: 15.1 g/dL (ref 13.0–17.0)
Lymphocytes Relative: 35.8 % (ref 12.0–46.0)
Lymphs Abs: 1.8 10*3/uL (ref 0.7–4.0)
MCHC: 33.8 g/dL (ref 30.0–36.0)
MCV: 89.7 fl (ref 78.0–100.0)
Monocytes Absolute: 0.3 10*3/uL (ref 0.1–1.0)
Monocytes Relative: 6.5 % (ref 3.0–12.0)
Neutro Abs: 2.8 10*3/uL (ref 1.4–7.7)
Neutrophils Relative %: 56.1 % (ref 43.0–77.0)
Platelets: 363 10*3/uL (ref 150.0–400.0)
RBC: 4.96 Mil/uL (ref 4.22–5.81)
RDW: 13.9 % (ref 11.5–15.5)
WBC: 4.9 10*3/uL (ref 4.0–10.5)

## 2023-11-19 LAB — HIV ANTIBODY (ROUTINE TESTING W REFLEX): HIV 1&2 Ab, 4th Generation: NONREACTIVE

## 2023-11-19 LAB — RPR: RPR Ser Ql: NONREACTIVE

## 2023-11-19 LAB — HEPATITIS C ANTIBODY: Hepatitis C Ab: NONREACTIVE

## 2023-11-19 LAB — HEMOGLOBIN A1C: Hgb A1c MFr Bld: 5.8 % (ref 4.6–6.5)

## 2023-11-19 LAB — PSA: PSA: 0.86 ng/mL (ref 0.10–4.00)

## 2023-11-19 NOTE — Assessment & Plan Note (Signed)
 Here for CPX  Also discussed the following. HTN:On amlodipine and telmisartan.  BP today is good, normal ambulatory BPs.  No change. Testosterone check?  I do not see a need to check, he agrees. Morbid obesity: Doing very well.  Encouraged to keep up with his new lifestyle. RTC 1 year

## 2023-11-19 NOTE — Assessment & Plan Note (Signed)
 Here for CPX Td 2019 Rec: flu shot q fall, and a covid vax if not done in the last 6 m Consider a shingles shot  CCS: never had a cscope, 3 options discussed, elected cologuard  Prostate ca screening: No symptoms,  check PSA Labs:BMP FLP CBC A1c TSH PSA.  Also request STD screening.  Check HIV, hep C and RPR Lifestyle: Excellent, the last month has completely changed his diet and is increasing his exercise.

## 2023-11-20 ENCOUNTER — Encounter: Payer: Self-pay | Admitting: Internal Medicine

## 2024-01-08 ENCOUNTER — Encounter: Admitting: Internal Medicine

## 2024-05-14 ENCOUNTER — Other Ambulatory Visit: Payer: Self-pay | Admitting: Internal Medicine

## 2024-05-14 DIAGNOSIS — I10 Essential (primary) hypertension: Secondary | ICD-10-CM

## 2024-05-16 ENCOUNTER — Other Ambulatory Visit: Payer: Self-pay | Admitting: Internal Medicine

## 2024-11-18 ENCOUNTER — Encounter: Admitting: Internal Medicine

## 2024-11-29 ENCOUNTER — Encounter: Admitting: Internal Medicine
